# Patient Record
Sex: Female | Born: 2000 | Race: Black or African American | Hispanic: No | Marital: Single | Smoking: Never smoker
Health system: Southern US, Community
[De-identification: ages and names within clinical notes are randomized; demographics above are authoritative.]

## PROBLEM LIST (undated history)

## (undated) HISTORY — PX: HERNIA REPAIR: SHX51

## (undated) HISTORY — PX: NO PAST SURGERIES: SHX2092

---
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
## 2021-02-13 ENCOUNTER — Other Ambulatory Visit: Payer: Self-pay

## 2021-02-13 ENCOUNTER — Ambulatory Visit
Admission: EM | Admit: 2021-02-13 | Discharge: 2021-02-13 | Disposition: A | Discharge status: Home / Self Care | Payer: Medicaid Other | Attending: Physician Assistant | Admitting: Physician Assistant

## 2021-02-13 DIAGNOSIS — R112 Nausea with vomiting, unspecified: Secondary | ICD-10-CM | POA: Diagnosis present

## 2021-02-13 DIAGNOSIS — N939 Abnormal uterine and vaginal bleeding, unspecified: Secondary | ICD-10-CM | POA: Diagnosis not present

## 2021-02-13 DIAGNOSIS — R1033 Periumbilical pain: Secondary | ICD-10-CM | POA: Diagnosis present

## 2021-02-13 DIAGNOSIS — B9689 Other specified bacterial agents as the cause of diseases classified elsewhere: Secondary | ICD-10-CM

## 2021-02-13 DIAGNOSIS — N760 Acute vaginitis: Secondary | ICD-10-CM

## 2021-02-13 LAB — URINALYSIS, COMPLETE (UACMP) WITH MICROSCOPIC
Bilirubin Urine: NEGATIVE
Glucose, UA: NEGATIVE mg/dL
Ketones, ur: NEGATIVE mg/dL
Nitrite: NEGATIVE
Protein, ur: 100 mg/dL — AB
RBC / HPF: >50 RBC/hpf (ref 0–5)
Specific Gravity, Urine: 1.02 (ref 1.005–1.030)
WBC, UA: >50 WBC/hpf (ref 0–5)
pH: 6.5 (ref 5.0–8.0)

## 2021-02-13 LAB — WET PREP, GENITAL
Sperm: NONE SEEN
Trich, Wet Prep: NONE SEEN
Yeast Wet Prep HPF POC: NONE SEEN

## 2021-02-13 LAB — PREGNANCY, URINE: Preg Test, Ur: NEGATIVE

## 2021-02-13 MED ORDER — ONDANSETRON HCL 4 MG PO TABS: 4.0000 mg | ORAL_TABLET | Freq: Three times a day (TID) | ORAL | 0 refills | Status: AC | PRN

## 2021-02-13 MED ORDER — METRONIDAZOLE 500 MG PO TABS: 500.0000 mg | ORAL_TABLET | Freq: Two times a day (BID) | ORAL | 0 refills | Status: AC

## 2021-02-13 NOTE — ED Provider Notes (Signed)
[facility]    CSN: [id number] Arrival date & time: 02/13/21  1456      History   Chief Complaint Chief Complaint  Patient presents with  . Abdominal Pain    HPI Vanessa Holland is a 20 y.o. female presenting for umbilical abdominal pain x2 days.  She says that the pain is intermittent.  It is associated with nausea and vomiting.  Patient states that symptoms started about 15 minutes after she had [place] food.  She has had one episode of diarrhea.  Patient states that she was vomiting "all day Monday and yesterday."  She says that today she has not vomited.  She has been able to eat today and hold down her food.  She is drinking well.  Patient denies any fever, fatigue, cough, congestion or sore throat.  Denies any dysuria, urinary frequency or urgency.  Patient does report that she has irregular menstrual periods.  She says that she has been bleeding "every other day since October 2021."  She says that she had her baby at that time and also had a Nexplanon placed.  Patient states that she followed up with gynecology in December and was advised that she was can have irregular periods.  She says that she tried to follow back up, but was not call back.  Patient does admit to some intermittent lower abdominal pain, but denies any today.  She says that it has gotten progressively better since it started a few months ago.  Denies any vaginal discharge, itching or odor.  No concern for STIs.  She has no other complaints or concerns today.  HPI  Past Medical History:  Diagnosis Date  . Umbilical hernia 2020    Patient Active Problem List   Diagnosis Date Noted  . Gestational hypertension 10/01/2020  . Unwanted fertility 10/01/2020  . Vaginal delivery 09/30/2020  . Supervision of normal first teen pregnancy 09/30/2020  . Infection of urinary tract in pregnancy 09/30/2020  . Low back pain during pregnancy 09/22/2020  . Late prenatal care affecting pregnancy 07/18/2020  .  Encounter for supervision of normal first pregnancy in second trimester 06/28/2020  . Attention deficit hyperactivity disorder (ADHD) 01/07/2018    Past Surgical History:  Procedure Laterality Date  . NO PAST SURGERIES      OB History    Gravida  1   Para  1   Term  1   Preterm      AB      Living  1     SAB      IAB      Ectopic      Multiple  0   Live Births  1            Home Medications    Prior to Admission medications   Medication Sig Start Date End Date Taking? Authorizing Provider  metroNIDAZOLE (FLAGYL) 500 MG tablet Take 1 tablet (500 mg total) by mouth 2 (two) times daily for 7 days. 02/13/21 02/20/21 Yes Eusebio Friendly B, PA-C  ondansetron (ZOFRAN) 4 MG tablet Take 1 tablet (4 mg total) by mouth every 8 (eight) hours as needed for up to 5 days for nausea or vomiting. 02/13/21 02/18/21 Yes Shirlee Latch, PA-C  acetaminophen (TYLENOL) 325 MG tablet Take 2 tablets (650 mg total) by mouth every 6 (six) hours as needed. Patient not taking: Reported on 11/22/2020 10/02/20   Sheila Oats, MD  estrogens, conjugated, (PREMARIN) 1.25 MG tablet Take 1 tablet (1.25  mg total) by mouth daily for 7 days. 11/22/20 11/29/20  Vena Austria, MD  ferrous sulfate 325 (65 FE) MG tablet Take 1 tablet (325 mg total) by mouth every other day. Patient not taking: Reported on 11/22/2020 10/03/20   Sheila Oats, MD  amLODipine (NORVASC) 5 MG tablet Take 1 tablet (5 mg total) by mouth daily. 10/02/20 11/14/20  Sheila Oats, MD    Family History No family history on file.  Social History Social History   Tobacco Use  . Smoking status: Never Smoker  . Smokeless tobacco: Never Used  Vaping Use  . Vaping Use: Former  Substance Use Topics  . Alcohol use: Never  . Drug use: Never     Allergies   Bee pollen and Shrimp [shellfish allergy]   Review of Systems Review of Systems  Constitutional: Negative for chills, diaphoresis, fatigue and fever.  HENT:  Negative for congestion, rhinorrhea and sore throat.   Respiratory: Negative for cough and shortness of breath.   Cardiovascular: Negative for chest pain.  Gastrointestinal: Positive for abdominal pain, nausea and vomiting.  Genitourinary: Positive for menstrual problem. Negative for dysuria, pelvic pain and vaginal discharge.  Musculoskeletal: Negative for arthralgias and myalgias.  Skin: Negative for rash.  Neurological: Negative for weakness and headaches.  Hematological: Negative for adenopathy.     Physical Exam Triage Vital Signs ED Triage Vitals  Enc Vitals Group     BP 02/13/21 1506 127/71     Pulse Rate 02/13/21 1506 74     Resp 02/13/21 1506 18     Temp 02/13/21 1506 97.8 F (36.6 C)     Temp Source 02/13/21 1506 Oral     SpO2 02/13/21 1506 100 %     Weight 02/13/21 1507 138 lb (62.6 kg)     Height 02/13/21 1507 5\' 3"  (1.6 m)     Head Circumference --      Peak Flow --      Pain Score 02/13/21 1506 4     Pain Loc --      Pain Edu? --      Excl. in GC? --    No data found.  Updated Vital Signs BP 127/71   Pulse 74   Temp 97.8 F (36.6 C) (Oral)   Resp 18   Ht 5\' 3"  (1.6 m)   Wt 138 lb (62.6 kg)   SpO2 100%   BMI 24.45 kg/m       Physical Exam Vitals and nursing note reviewed.  Constitutional:      General: She is not in acute distress.    Appearance: Normal appearance. She is not ill-appearing or toxic-appearing.  HENT:     Head: Normocephalic and atraumatic.     Nose: Nose normal.     Mouth/Throat:     Mouth: Mucous membranes are moist.     Pharynx: Oropharynx is clear.  Eyes:     General: No scleral icterus.       Right eye: No discharge.        Left eye: No discharge.     Conjunctiva/sclera: Conjunctivae normal.  Cardiovascular:     Rate and Rhythm: Normal rate and regular rhythm.     Heart sounds: Normal heart sounds.  Pulmonary:     Effort: Pulmonary effort is normal. No respiratory distress.     Breath sounds: Normal breath sounds.   Abdominal:     Tenderness: There is abdominal tenderness (mild epigastric TTP only). There is no guarding or  rebound.  Musculoskeletal:     Cervical back: Neck supple.  Skin:    General: Skin is dry.  Neurological:     General: No focal deficit present.     Mental Status: She is alert. Mental status is at baseline.     Motor: No weakness.     Gait: Gait normal.  Psychiatric:        Mood and Affect: Mood normal.        Behavior: Behavior normal.        Thought Content: Thought content normal.      UC Treatments / Results  Labs (all labs ordered are listed, but only abnormal results are displayed) Labs Reviewed  WET PREP, GENITAL - Abnormal; Notable for the following components:      Result Value   Clue Cells Wet Prep HPF POC PRESENT (*)    WBC, Wet Prep HPF POC MANY (*)    All other components within normal limits  URINALYSIS, COMPLETE (UACMP) WITH MICROSCOPIC - Abnormal; Notable for the following components:   Color, Urine AMBER (*)    APPearance CLOUDY (*)    Hgb urine dipstick LARGE (*)    Protein, ur 100 (*)    Leukocytes,Ua TRACE (*)    Bacteria, UA MANY (*)    All other components within normal limits  URINE CULTURE  PREGNANCY, URINE    EKG   Radiology No results found.  Procedures Procedures (including critical care time)  Medications Ordered in UC Medications - No data to display  Initial Impression / Assessment and Plan / UC Course  I have reviewed the triage vital signs and the nursing notes.  Pertinent labs & imaging results that were available during my care of the patient were reviewed by me and considered in my medical decision making (see chart for details).   1.  Epigastric pain/nausea/vomiting: Based on patient's symptoms, concern for food poisoning which is adverse reaction to the [place] food that she ate.  Advised increasing rest and fluids.  It does seem like symptoms of gotten better today.  I did send Zofran to pharmacy.  Advised  Tylenol for discomfort.  Advised to return or go to ED for any worsening abdominal pain or if she develops a fever, dark stools or blood in the stool.  2.  Bacterial vaginosis: Wet prep shows positive clue cells.  Discussed this with patient and advised treatment with metronidazole.  She opted for the oral medication.  Advised her to take the full course.  3.  Lower abdominal pain and irregular menstrual periods.:  I did offer lab work today including CBC and CMP.  Patient declined.  Advised her that she needs to follow-up with OB/GYN as soon as possible.  Advised to call tomorrow to make a follow-up appointment.  Advised that symptoms may be due to the birth control and she should discuss other options.  Advised also that she is possible she may need an ultrasound of her pelvic region.  Patient does agree and says she will call tomorrow.  Advised to go to ED for any severe pelvic pain or if she has any increased bleeding or fatigue or weakness.   Final Clinical Impressions(s) / UC Diagnoses   Final diagnoses:  Periumbilical abdominal pain  Non-intractable vomiting with nausea, unspecified vomiting type  Bacterial vaginosis  Abnormal vaginal bleeding     Discharge Instructions     Your abdominal pain and nausea/vomiting is likely due to the food that you ate.  If  this is true food poisoning it should only last a couple of days and it does seem like you are improving.  You should increase your rest and fluids.  I have sent Zofran to the pharmacy for you for the nausea and vomiting.  If the abdominal pain worsens or you develop a fever or intractable vomiting or weakness you should be seen again either here or in the ED.  Your vaginal swab shows that you have bacterial vaginosis.  See more about this in the instructions I have sent.  Does require treatment with an antibiotic.  You should contact your OB/gynecologist as soon as possible to discuss an appointment.  You have had irregular bleeding  for several months following the delivery of your baby and the start of Nexplanon.  There are other birth control options that you can discuss that do not cause as much problems with bleeding.  Go to ED if you have any severe pelvic pain, fatigue or weakness, excessive or worsening bleeding, or any symptoms worsen.    ED Prescriptions    Medication Sig Dispense Auth. Provider   metroNIDAZOLE (FLAGYL) 500 MG tablet Take 1 tablet (500 mg total) by mouth 2 (two) times daily for 7 days. 14 tablet Eusebio Friendly B, PA-C   ondansetron (ZOFRAN) 4 MG tablet Take 1 tablet (4 mg total) by mouth every 8 (eight) hours as needed for up to 5 days for nausea or vomiting. 15 tablet Gareth Morgan     PDMP not reviewed this encounter.   Shirlee Latch, PA-C 02/13/21 1645

## 2021-02-13 NOTE — ED Triage Notes (Signed)
Pt reports having abd pain and several episodes of emesis since Monday. sts she is not sure if it is food poisoning, she had [place] food on Monday.

## 2021-02-13 NOTE — Discharge Instructions (Signed)
Your abdominal pain and nausea/vomiting is likely due to the food that you ate.  If this is true food poisoning it should only last a couple of days and it does seem like you are improving.  You should increase your rest and fluids.  I have sent Zofran to the pharmacy for you for the nausea and vomiting.  If the abdominal pain worsens or you develop a fever or intractable vomiting or weakness you should be seen again either here or in the ED.  Your vaginal swab shows that you have bacterial vaginosis.  See more about this in the instructions I have sent.  Does require treatment with an antibiotic.  You should contact your OB/gynecologist as soon as possible to discuss an appointment.  You have had irregular bleeding for several months following the delivery of your baby and the start of Nexplanon.  There are other birth control options that you can discuss that do not cause as much problems with bleeding.  Go to ED if you have any severe pelvic pain, fatigue or weakness, excessive or worsening bleeding, or any symptoms worsen.

## 2021-02-15 LAB — URINE CULTURE: Culture: 40000 — AB

## 2021-03-21 ENCOUNTER — Other Ambulatory Visit: Payer: Self-pay

## 2021-03-21 ENCOUNTER — Ambulatory Visit
Admission: EM | Admit: 2021-03-21 | Discharge: 2021-03-21 | Disposition: A | Discharge status: Home / Self Care | Payer: Medicaid Other | Attending: Family Medicine | Admitting: Family Medicine

## 2021-03-21 DIAGNOSIS — R1031 Right lower quadrant pain: Secondary | ICD-10-CM

## 2021-03-21 NOTE — ED Notes (Signed)
Patient is being discharged from the Urgent Care and sent to the Emergency Department via POV . Per Dr. Adriana Simas, patient is in need of higher level of care due to possible appendicitis. Patient is aware and verbalizes understanding of plan of care.  Vitals:   03/21/21 1120  BP: 118/70  Pulse: 96  Resp: 18  Temp: 98.5 F (36.9 C)  SpO2: 100%

## 2021-03-21 NOTE — ED Triage Notes (Signed)
Patient complains of RLQ pain that started 2 days ago and has been worsening. Patient states that pain has been constant.

## 2021-03-21 NOTE — ED Provider Notes (Signed)
[facility]    CSN: [id number] Arrival date & time: 03/21/21  1057      History   Chief Complaint Chief Complaint  Patient presents with  . Abdominal Pain    RLQ   HPI   20 year old female presents with abdominal pain.  Patient reports that her pain has been worsening over the past 2 to 3 days.  Located in the right lower quadrant..  Denies nausea, vomiting.  Denies anorexia.  No fever.  Pain is 8/10 in severity.  No relieving factors.  No other associated symptoms.  No other complaints.  Past Medical History:  Diagnosis Date  . Umbilical hernia 2020    Patient Active Problem List   Diagnosis Date Noted  . Gestational hypertension 10/01/2020  . Unwanted fertility 10/01/2020  . Vaginal delivery 09/30/2020  . Supervision of normal first teen pregnancy 09/30/2020  . Infection of urinary tract in pregnancy 09/30/2020  . Low back pain during pregnancy 09/22/2020  . Late prenatal care affecting pregnancy 07/18/2020  . Encounter for supervision of normal first pregnancy in second trimester 06/28/2020  . Attention deficit hyperactivity disorder (ADHD) 01/07/2018    Past Surgical History:  Procedure Laterality Date  . NO PAST SURGERIES      OB History    Gravida  1   Para  1   Term  1   Preterm      AB      Living  1     SAB      IAB      Ectopic      Multiple  0   Live Births  1            Home Medications    Prior to Admission medications   Medication Sig Start Date End Date Taking? Authorizing Provider  acetaminophen (TYLENOL) 325 MG tablet Take 2 tablets (650 mg total) by mouth every 6 (six) hours as needed. 10/02/20  Yes Sheila Oats, MD  estrogens, conjugated, (PREMARIN) 1.25 MG tablet Take 1 tablet (1.25 mg total) by mouth daily for 7 days. 11/22/20 11/29/20  Vena Austria, MD  ferrous sulfate 325 (65 FE) MG tablet TAKE 1 TABLET (325 MG TOTAL) BY MOUTH EVERY OTHER DAY. Patient not taking: No sig reported 10/02/20  10/02/21  Sheila Oats, MD  Prenatal 27-1 MG TABS TAKE 1 TABLET BY MOUTH DAILY AT 12 NOON 10/02/20 10/02/21  Alric Seton, MD  amLODipine (NORVASC) 5 MG tablet Take 1 tablet (5 mg total) by mouth daily. 10/02/20 11/14/20  Sheila Oats, MD    Family History Family History  Problem Relation Age of Onset  . Healthy Mother   . Healthy Father     Social History Social History   Tobacco Use  . Smoking status: Never Smoker  . Smokeless tobacco: Never Used  Vaping Use  . Vaping Use: Former  Substance Use Topics  . Alcohol use: Never  . Drug use: Never     Allergies   Bee pollen and Shrimp [shellfish allergy]   Review of Systems Review of Systems  Constitutional: Negative for appetite change and fever.  Gastrointestinal: Positive for abdominal pain. Negative for nausea and vomiting.   Physical Exam Triage Vital Signs ED Triage Vitals  Enc Vitals Group     BP 03/21/21 1120 118/70     Pulse Rate 03/21/21 1120 96     Resp 03/21/21 1120 18     Temp 03/21/21 1120 98.5 F (36.9 C)  Temp Source 03/21/21 1120 Oral     SpO2 03/21/21 1120 100 %     Weight 03/21/21 1117 136 lb (61.7 kg)     Height 03/21/21 1117 5\' 3"  (1.6 m)     Head Circumference --      Peak Flow --      Pain Score 03/21/21 1117 8     Pain Loc --      Pain Edu? --      Excl. in GC? --    Updated Vital Signs BP 118/70 (BP Location: Left Arm)   Pulse 96   Temp 98.5 F (36.9 C) (Oral)   Resp 18   Ht 5\' 3"  (1.6 m)   Wt 61.7 kg   SpO2 100%   BMI 24.09 kg/m   Visual Acuity Right Eye Distance:   Left Eye Distance:   Bilateral Distance:    Right Eye Near:   Left Eye Near:    Bilateral Near:     Physical Exam Vitals and nursing note reviewed.  Constitutional:      General: She is not in acute distress.    Appearance: She is well-developed. She is not ill-appearing.  HENT:     Head: Normocephalic and atraumatic.  Pulmonary:     Effort: Pulmonary effort is normal. No respiratory  distress.  Abdominal:     General: There is no distension.     Palpations: Abdomen is soft.     Comments: Exquisitely tender in the right lower quadrant.  Umbilical hernia noted.  Neurological:     Mental Status: She is alert.  Psychiatric:        Mood and Affect: Mood normal.        Behavior: Behavior normal.     UC Treatments / Results  Labs (all labs ordered are listed, but only abnormal results are displayed) Labs Reviewed - No data to display  EKG   Radiology No results found.  Procedures Procedures (including critical care time)  Medications Ordered in UC Medications - No data to display  Initial Impression / Assessment and Plan / UC Course  I have reviewed the triage vital signs and the nursing notes.  Pertinent labs & imaging results that were available during my care of the patient were reviewed by me and considered in my medical decision making (see chart for details).    20 year old female presents with right lower quadrant pain.  Concern for possible appendicitis.  Patient needs further evaluation in the emergency room.  Patient is going to [facility].   Final Clinical Impressions(s) / UC Diagnoses   Final diagnoses:  RLQ abdominal pain     Discharge Instructions     You need to go to the ER.  You need labs and CT scan to assess for appendicitis.  Either [facility] or [facility].  Take care  Dr. [facility]    ED Prescriptions    None     PDMP not reviewed this encounter.   [facility], DO 03/21/21 1134

## 2021-03-21 NOTE — Discharge Instructions (Signed)
You need to go to the ER.  You need labs and CT scan to assess for appendicitis.  Either [facility] or [facility].  Take care  Dr. Adriana Simas

## 2021-06-24 ENCOUNTER — Encounter: Payer: Self-pay | Admitting: Emergency Medicine

## 2021-06-24 ENCOUNTER — Other Ambulatory Visit: Payer: Self-pay

## 2021-06-24 ENCOUNTER — Ambulatory Visit
Admission: EM | Admit: 2021-06-24 | Discharge: 2021-06-24 | Disposition: A | Discharge status: Home / Self Care | Payer: Medicaid Other | Attending: Internal Medicine | Admitting: Internal Medicine

## 2021-06-24 ENCOUNTER — Ambulatory Visit (INDEPENDENT_AMBULATORY_CARE_PROVIDER_SITE_OTHER): Payer: Medicaid Other

## 2021-06-24 DIAGNOSIS — K5900 Constipation, unspecified: Secondary | ICD-10-CM

## 2021-06-24 DIAGNOSIS — K5901 Slow transit constipation: Secondary | ICD-10-CM | POA: Insufficient documentation

## 2021-06-24 LAB — POCT URINALYSIS DIP (DEVICE)
Bilirubin Urine: NEGATIVE
Glucose, UA: NEGATIVE mg/dL
Hgb urine dipstick: NEGATIVE
Ketones, ur: NEGATIVE mg/dL
Leukocytes,Ua: NEGATIVE
Nitrite: NEGATIVE
Protein, ur: NEGATIVE mg/dL
Specific Gravity, Urine: 1.025 (ref 1.005–1.030)
Urobilinogen, UA: 1 mg/dL (ref 0.0–1.0)
pH: 7 (ref 5.0–8.0)

## 2021-06-24 LAB — POCT PREGNANCY, URINE: Preg Test, Ur: NEGATIVE

## 2021-06-24 MED ORDER — MAGNESIUM CITRATE PO SOLN: 1.0000 | Freq: Once | ORAL | 0 refills | Status: AC

## 2021-06-24 MED ORDER — MAGNESIUM CITRATE 200 MG PO TABS
ORAL_TABLET | ORAL | 0 refills | Status: DC
Start: 2021-06-24 — End: 2022-03-17

## 2021-06-24 NOTE — Discharge Instructions (Addendum)
Drink a bottle of Magnesium Citrate tonight to help you empty out your bowels.  Increase your water intake and fiber  Drink 1-2 bottles of mineral water during the day instead of regular water You may also drink prune juice every morning instead of taking magnesium pills which I sent to your pharmacy.

## 2021-06-24 NOTE — ED Triage Notes (Addendum)
Pt is present today with frequent urination, diarrhea, and abdominal pain. Pt states that her pain started x3 days ago. Pt denies any fever

## 2021-06-24 NOTE — ED Provider Notes (Signed)
[facility]    CSN: [id number] Arrival date & time: 06/24/21  1718      History   Chief Complaint Chief Complaint  Patient presents with   Abdominal Pain    HPI Vanessa Holland is a 20 y.o. female who presents with onset of upper abdominal pain x 3 days. Has hx of chronic constipation and the last time she had a BM was 2 days ago, then today had diarrhea x 1.     Past Medical History:  Diagnosis Date   Umbilical hernia 2020    Patient Active Problem List   Diagnosis Date Noted   Gestational hypertension 10/01/2020   Unwanted fertility 10/01/2020   Vaginal delivery 09/30/2020   Supervision of normal first teen pregnancy 09/30/2020   Infection of urinary tract in pregnancy 09/30/2020   Low back pain during pregnancy 09/22/2020   Late prenatal care affecting pregnancy 07/18/2020   Encounter for supervision of normal first pregnancy in second trimester 06/28/2020   Attention deficit hyperactivity disorder (ADHD) 01/07/2018    Past Surgical History:  Procedure Laterality Date   NO PAST SURGERIES      OB History     Gravida  1   Para  1   Term  1   Preterm      AB      Living  1      SAB      IAB      Ectopic      Multiple  0   Live Births  1            Home Medications    Prior to Admission medications   Medication Sig Start Date End Date Taking? Authorizing Provider  Magnesium Citrate 200 MG TABS Take 1-2 at bed time for chronic constipation. 06/24/21  Yes Rodriguez-Southworth, Nettie Elm, PA-C  magnesium citrate SOLN Take 296 mLs (1 Bottle total) by mouth once for 1 dose. 06/24/21 06/24/21 Yes Rodriguez-Southworth, Nettie Elm, PA-C  acetaminophen (TYLENOL) 325 MG tablet Take 2 tablets (650 mg total) by mouth every 6 (six) hours as needed. 10/02/20   Sheila Oats, MD  estrogens, conjugated, (PREMARIN) 1.25 MG tablet Take 1 tablet (1.25 mg total) by mouth daily for 7 days. 11/22/20 11/29/20  Vena Austria, MD  amLODipine (NORVASC)  5 MG tablet Take 1 tablet (5 mg total) by mouth daily. 10/02/20 11/14/20  Sheila Oats, MD    Family History Family History  Problem Relation Age of Onset   Healthy Mother    Healthy Father     Social History Social History   Tobacco Use   Smoking status: Never   Smokeless tobacco: Never  Vaping Use   Vaping Use: Former  Substance Use Topics   Alcohol use: Never   Drug use: Never     Allergies   Bee pollen and Shrimp [shellfish allergy]   Review of Systems Review of Systems  Constitutional:  Negative for fever.  Gastrointestinal:  Positive for abdominal distention, abdominal pain, constipation and diarrhea. Negative for nausea and vomiting.    Physical Exam Triage Vital Signs ED Triage Vitals  Enc Vitals Group     BP 06/24/21 1820 120/80     Pulse Rate 06/24/21 1820 85     Resp 06/24/21 1820 18     Temp 06/24/21 1820 98.3 F (36.8 C)     Temp src --      SpO2 06/24/21 1820 100 %     Weight --  Height --      Head Circumference --      Peak Flow --      Pain Score 06/24/21 1818 7     Pain Loc --      Pain Edu? --      Excl. in GC? --    No data found.  Updated Vital Signs BP 120/80   Pulse 85   Temp 98.3 F (36.8 C)   Resp 18   LMP 06/13/2021 Comment: denies pre, hcg neg  SpO2 100%   Visual Acuity Right Eye Distance:   Left Eye Distance:   Bilateral Distance:    Right Eye Near:   Left Eye Near:    Bilateral Near:     Physical Exam Constitutional:      General: She is not in acute distress.    Appearance: She is normal weight. She is not toxic-appearing.  HENT:     Head: Normocephalic.     Right Ear: External ear normal.     Left Ear: External ear normal.  Eyes:     General: No scleral icterus.    Conjunctiva/sclera: Conjunctivae normal.  Pulmonary:     Effort: Pulmonary effort is normal.  Abdominal:     General: Bowel sounds are normal.     Palpations: Abdomen is soft.     Tenderness: There is abdominal tenderness. There  is no guarding or rebound.     Comments: Has a ventral hernia to the R of her umbilicus. Has mild tenderness on epigastric and R mid and lower abdomen  Musculoskeletal:        General: Normal range of motion.     Cervical back: Neck supple.  Skin:    General: Skin is warm and dry.     Findings: No rash.  Neurological:     Mental Status: She is alert and oriented to person, place, and time.     Gait: Gait normal.  Psychiatric:        Mood and Affect: Mood normal.        Behavior: Behavior normal.        Thought Content: Thought content normal.        Judgment: Judgment normal.     UC Treatments / Results  Labs (all labs ordered are listed, but only abnormal results are displayed) Labs Reviewed  POCT URINALYSIS DIPSTICK, ED / UC  POCT URINALYSIS DIP (DEVICE)  POC URINE PREG, ED  POCT PREGNANCY, URINE    EKG   Radiology DG Abd 2 Views  Result Date: 06/24/2021 CLINICAL DATA:  Constipation EXAM: ABDOMEN - 2 VIEW COMPARISON:  None. FINDINGS: The bowel gas pattern is normal. There is no evidence of free air. No radio-opaque calculi or other significant radiographic abnormality is seen. IMPRESSION: Normal abdominal radiographs. Electronically Signed   By: Deatra Robinson M.D.   On: 06/24/2021 19:32    Procedures Procedures (including critical care time)  Medications Ordered in UC Medications - No data to display  Initial Impression / Assessment and Plan / UC Course  I have reviewed the triage vital signs and the nursing notes. Pertinent labs & imaging results that were available during my care of the patient were reviewed by me and considered in my medical decision making (see chart for details). Advised to Fu with PCP in one week if symptoms do not improve with emptying her colon.  Has chronic constipation and ventral hernia. See instructions.     Final Clinical Impressions(s) / UC Diagnoses  Final diagnoses:  Slow transit constipation     Discharge Instructions       Drink a bottle of Magnesium Citrate tonight to help you empty out your bowels.  Increase your water intake and fiber  Drink 1-2 bottles of mineral water during the day instead of regular water You may also drink prune juice every morning instead of taking magnesium pills which I sent to your pharmacy.      ED Prescriptions     Medication Sig Dispense Auth. Provider   magnesium citrate SOLN Take 296 mLs (1 Bottle total) by mouth once for 1 dose. 195 mL Rodriguez-Southworth, Luria Rosario, PA-C   Magnesium Citrate 200 MG TABS Take 1-2 at bed time for chronic constipation. 90 tablet Rodriguez-Southworth, Nettie Elm, PA-C      PDMP not reviewed this encounter.   Garey Ham, Cordelia Poche 06/24/21 2038

## 2021-11-01 ENCOUNTER — Ambulatory Visit
Admission: EM | Admit: 2021-11-01 | Discharge: 2021-11-01 | Disposition: A | Discharge status: Home / Self Care | Payer: Medicaid Other

## 2021-11-01 ENCOUNTER — Other Ambulatory Visit: Payer: Self-pay

## 2021-11-15 ENCOUNTER — Other Ambulatory Visit: Payer: Self-pay

## 2021-11-15 ENCOUNTER — Ambulatory Visit
Admission: EM | Admit: 2021-11-15 | Discharge: 2021-11-15 | Disposition: A | Discharge status: Home / Self Care | Payer: Medicaid Other

## 2021-11-15 ENCOUNTER — Encounter: Payer: Self-pay | Admitting: Emergency Medicine

## 2021-11-15 DIAGNOSIS — M25561 Pain in right knee: Secondary | ICD-10-CM

## 2021-11-15 MED ORDER — NAPROXEN 500 MG PO TABS: 500.0000 mg | ORAL_TABLET | Freq: Two times a day (BID) | ORAL | 0 refills | Status: DC

## 2021-11-15 NOTE — ED Provider Notes (Signed)
[facility]    CSN: [id number] Arrival date & time: 11/15/21  1605      History   Chief Complaint Chief Complaint  Patient presents with   Knee Pain    right    HPI Vanessa Holland is a 20 y.o. female.   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  She states that in this effort she twisted her left knee and felt a pop sensation but did not fall.  She reports that she felt a fullness sensation in the left knee along with pain and had some tenderness in knee and increased warmth for the first few days after.  Symptoms have been flared since off and on.  She reports that she tripped a few days ago but did not think that she really aggravated the knee however yesterday the knee pain worsened again and she felt that she needed to have a evaluation as this has been giving her trouble.  She denies any significant pain with ambulation.  She feels that her knee is slightly unstable.  No previous trouble with this knee or past injury.  She has tried a knee brace but she thinks that this was too tight and this was painful to her. She has also tried cold compress and tylenol.   Past Medical History:  Diagnosis Date   Umbilical hernia 2020    Patient Active Problem List   Diagnosis Date Noted   Gestational hypertension 10/01/2020   Unwanted fertility 10/01/2020   Vaginal delivery 09/30/2020   Supervision of normal first teen pregnancy 09/30/2020   Infection of urinary tract in pregnancy 09/30/2020   Low back pain during pregnancy 09/22/2020   Late prenatal care affecting pregnancy 07/18/2020   Encounter for supervision of normal first pregnancy in second trimester 06/28/2020   Attention deficit hyperactivity disorder (ADHD) 01/07/2018    Past Surgical History:  Procedure Laterality Date   NO PAST SURGERIES      OB History     Gravida  1   Para  1   Term  1   Preterm      AB      Living  1       SAB      IAB      Ectopic      Multiple  0   Live Births  1            Home Medications    Prior to Admission medications   Medication Sig Start Date End Date Taking? Authorizing Provider  etonogestrel (NEXPLANON) 68 MG IMPL implant 1 each by Subdermal route once.   Yes [provider]  acetaminophen (TYLENOL) 325 MG tablet Take 2 tablets (650 mg total) by mouth every 6 (six) hours as needed. 10/02/20   Sheila Oats, MD  estrogens, conjugated, (PREMARIN) 1.25 MG tablet Take 1 tablet (1.25 mg total) by mouth daily for 7 days. 11/22/20 11/29/20  Vena Austria, MD  Magnesium Citrate 200 MG TABS Take 1-2 at bed time for chronic constipation. 06/24/21   Rodriguez-Southworth, Nettie Elm, PA-C  amLODipine (NORVASC) 5 MG tablet Take 1 tablet (5 mg total) by mouth daily. 10/02/20 11/14/20  Sheila Oats, MD    Family History Family History  Problem Relation Age of Onset   Healthy Mother    Healthy Father     Social History Social History   Tobacco Use   Smoking  status: Never   Smokeless tobacco: Never  Vaping Use   Vaping Use: Some days  Substance Use Topics   Alcohol use: Never   Drug use: Never     Allergies   Bee pollen and Shrimp [shellfish allergy]   Review of Systems Review of Systems  As stated above in HPI Physical Exam Triage Vital Signs ED Triage Vitals  Enc Vitals Group     BP 11/15/21 1750 115/76     Pulse Rate 11/15/21 1750 75     Resp 11/15/21 1750 14     Temp 11/15/21 1750 98.9 F (37.2 C)     Temp Source 11/15/21 1750 Oral     SpO2 11/15/21 1750 98 %     Weight 11/15/21 1748 136 lb (61.7 kg)     Height 11/15/21 1748 5\' 3"  (1.6 m)     Head Circumference --      Peak Flow --      Pain Score 11/15/21 1747 8     Pain Loc --      Pain Edu? --      Excl. in GC? --    No data found.  Updated Vital Signs BP 115/76 (BP Location: Right Arm)   Pulse 75   Temp 98.9 F (37.2 C) (Oral)   Resp 14   Ht 5\' 3"  (1.6 m)   Wt 136  lb (61.7 kg)   SpO2 98%   Breastfeeding No   BMI 24.09 kg/m   Physical Exam Vitals and nursing note reviewed.  Constitutional:      General: She is not in acute distress.    Appearance: Normal appearance. She is not ill-appearing, toxic-appearing or diaphoretic.  HENT:     Head: Normocephalic and atraumatic.  Musculoskeletal:     Right knee: Swelling (mild of anterior knee) and effusion present. No deformity, erythema, ecchymosis, lacerations, bony tenderness or crepitus. Decreased range of motion (25% decrease throughout due to pain). Tenderness present over the medial joint line, lateral joint line, MCL, LCL and patellar tendon. Abnormal meniscus. Normal alignment and normal patellar mobility.     Instability Tests: Anterior drawer test negative. Posterior drawer test negative. Anterior Lachman test negative. Medial McMurray test negative and lateral McMurray test negative.     Left knee: Normal.  Skin:    General: Skin is warm.  Neurological:     General: No focal deficit present.     Mental Status: She is alert and oriented to person, place, and time.     Motor: No weakness.     Gait: Gait normal.     Deep Tendon Reflexes: Reflexes normal.     UC Treatments / Results  Labs (all labs ordered are listed, but only abnormal results are displayed) Labs Reviewed - No data to display  EKG   Radiology No results found.  Procedures Procedures (including critical care time)  Medications Ordered in UC Medications - No data to display  Initial Impression / Assessment and Plan / UC Course  I have reviewed the triage vital signs and the nursing notes.  Pertinent labs & imaging results that were available during my care of the patient were reviewed by me and considered in my medical decision making (see chart for details).     New.  As this injury occurred over a month ago and she is able to ambulate without bony tenderness we are going to hold off on x-ray imaging at this  time.  Instead I believe that she likely  has an injury within the knee.  I will treat with a knee stabilizer along with crutches with nonweightbearing status for the next 2 weeks and follow-up with orthopedics.  She can use cold compress and NSAIDs. Final Clinical Impressions(s) / UC Diagnoses   Final diagnoses:  None   Discharge Instructions   None    ED Prescriptions   None    PDMP not reviewed this encounter.   Rushie Chestnut, [place] 11/15/21 1831

## 2021-11-15 NOTE — ED Triage Notes (Signed)
Patient states that las month on 10/12/21 she twisted her right knee and has since been having pain and swelling in her right knee.

## 2022-03-17 ENCOUNTER — Ambulatory Visit
Admission: EM | Admit: 2022-03-17 | Discharge: 2022-03-17 | Disposition: A | Discharge status: Home / Self Care | Payer: Medicaid Other | Attending: Physician Assistant | Admitting: Physician Assistant

## 2022-03-17 DIAGNOSIS — J029 Acute pharyngitis, unspecified: Secondary | ICD-10-CM

## 2022-03-17 DIAGNOSIS — J069 Acute upper respiratory infection, unspecified: Secondary | ICD-10-CM | POA: Diagnosis present

## 2022-03-17 DIAGNOSIS — N898 Other specified noninflammatory disorders of vagina: Secondary | ICD-10-CM | POA: Diagnosis present

## 2022-03-17 LAB — GROUP A STREP BY PCR: Group A Strep by PCR: NOT DETECTED

## 2022-03-17 MED ORDER — METRONIDAZOLE 0.75 % VA GEL: VAGINAL | 0 refills | Status: DC

## 2022-03-17 NOTE — Discharge Instructions (Addendum)
Come back when your period is over if you still have the abnormal vaginal odor ? ?You may take Dayquil as needed for your symptoms. Cold last up to 2 weeks, but if you get a fever of 100.5 or more and worse, come back.  ?

## 2022-03-17 NOTE — ED Triage Notes (Signed)
Pt c/o sore throat, nasal congestion x2-3days  ? ?Pt took a home covid test at home and it was negative. Pt declines covid or flu testing and wants her symptoms treated.  ? ?Pt wants a strep test done.  ? ?Pt wants a female doctor for a personal question.  ?

## 2022-03-17 NOTE — ED Provider Notes (Signed)
?[facility] ? ? ? ?CSN: [id number] ?Arrival date & time: 03/17/22  1549 ? ? ?  ? ?History   ?Chief Complaint ?Chief Complaint  ?Patient presents with  ? Sore Throat  ? Nasal Congestion  ? ? ?HPI ?Vanessa Holland is a 21 y.o. female who presents with URI and ST x 3 days. Had negative Covid test at home.  ?2- has been having abnormal vaginal odor with  thin discharge between her periods. Denies itching. She is currently on her cycle right now. She does not think she could have left a tampon inside her.  ? ? ?Past Medical History:  ?Diagnosis Date  ? Umbilical hernia 2020  ? ? ?Patient Active Problem List  ? Diagnosis Date Noted  ? Gestational hypertension 10/01/2020  ? Unwanted fertility 10/01/2020  ? Vaginal delivery 09/30/2020  ? Supervision of normal first teen pregnancy 09/30/2020  ? Infection of urinary tract in pregnancy 09/30/2020  ? Low back pain during pregnancy 09/22/2020  ? Late prenatal care affecting pregnancy 07/18/2020  ? Encounter for supervision of normal first pregnancy in second trimester 06/28/2020  ? Attention deficit hyperactivity disorder (ADHD) 01/07/2018  ? ? ?Past Surgical History:  ?Procedure Laterality Date  ? NO PAST SURGERIES    ? ? ?OB History   ? ? Gravida  ?1  ? Para  ?1  ? Term  ?1  ? Preterm  ?   ? AB  ?   ? Living  ?1  ?  ? ? SAB  ?   ? IAB  ?   ? Ectopic  ?   ? Multiple  ?0  ? Live Births  ?1  ?   ?  ?  ? ? ? ?Home Medications   ? ?Prior to Admission medications   ?Medication Sig Start Date End Date Taking? Authorizing Provider  ?metroNIDAZOLE (METROGEL VAGINAL) 0.75 % vaginal gel One applicator qhs x 5 nights 03/17/22  Yes Rodriguez-Southworth, Nettie Elm, PA-C  ?amLODipine (NORVASC) 5 MG tablet Take 1 tablet (5 mg total) by mouth daily. 10/02/20 11/14/20  Sheila Oats, MD  ? ? ?Family History ?Family History  ?Problem Relation Age of Onset  ? Healthy Mother   ? Healthy Father   ? ? ?Social History ?Social History  ? ?Tobacco Use  ? Smoking status: Never  ? Smokeless  tobacco: Never  ?Vaping Use  ? Vaping Use: Some days  ?Substance Use Topics  ? Alcohol use: Never  ? Drug use: Never  ? ? ? ?Allergies   ?Bee pollen and Shrimp [shellfish allergy] ? ? ?Review of Systems ?Review of Systems  ?Constitutional:  Negative for appetite change, chills, fatigue and fever.  ?HENT:  Positive for congestion, postnasal drip, rhinorrhea and sore throat. Negative for ear discharge, ear pain and trouble swallowing.   ?Respiratory:  Positive for cough.   ?Genitourinary:  Positive for menstrual problem and vaginal discharge. Negative for dysuria, frequency, genital sores, pelvic pain and urgency.  ?Musculoskeletal:  Negative for myalgias.  ?Neurological:  Negative for headaches.  ?Hematological:  Negative for adenopathy.  ? ? ?Physical Exam ?Triage Vital Signs ?ED Triage Vitals  ?Enc Vitals Group  ?   BP 03/17/22 1600 115/84  ?   Pulse Rate 03/17/22 1600 93  ?   Resp 03/17/22 1600 18  ?   Temp 03/17/22 1600 98.2 ?F (36.8 ?C)  ?   Temp Source 03/17/22 1600 Oral  ?   SpO2 03/17/22 1600 98 %  ?   Weight  03/17/22 1559 138 lb (62.6 kg)  ?   Height 03/17/22 1559 5\' 3"  (1.6 m)  ?   Head Circumference --   ?   Peak Flow --   ?   Pain Score 03/17/22 1558 8  ?   Pain Loc --   ?   Pain Edu? --   ?   Excl. in GC? --   ? ?No data found. ? ?Updated Vital Signs ?BP 115/84 (BP Location: Left Arm)   Pulse 93   Temp 98.2 ?F (36.8 ?C) (Oral)   Resp 18   Ht 5\' 3"  (1.6 m)   Wt 138 lb (62.6 kg)   LMP 03/17/2022   SpO2 98%   BMI 24.45 kg/m?  ? ?Visual Acuity ?Right Eye Distance:   ?Left Eye Distance:   ?Bilateral Distance:   ? ?Right Eye Near:   ?Left Eye Near:    ?Bilateral Near:    ? ?Physical Exam ?Vitals reviewed.  ?HENT:  ?   Head: Normocephalic.  ?   Right Ear: Tympanic membrane, ear canal and external ear normal.  ?   Left Ear: Tympanic membrane, ear canal and external ear normal.  ?   Mouth/Throat:  ?   Mouth: Mucous membranes are moist.  ?   Pharynx: Oropharynx is clear.  ?Eyes:  ?   General: No scleral  icterus. ?   Conjunctiva/sclera: Conjunctivae normal.  ?Pulmonary:  ?   Effort: Pulmonary effort is normal.  ?   Breath sounds: Normal breath sounds.  ?Genitourinary: ?   General: Normal vulva.  ?   Comments: No FB seen. She is bleeding from menses  ?Musculoskeletal:     ?   General: Normal range of motion.  ?   Cervical back: Neck supple.  ?Lymphadenopathy:  ?   Cervical: No cervical adenopathy.  ?Skin: ?   General: Skin is warm and dry.  ?   Findings: No rash.  ?Neurological:  ?   Mental Status: She is alert and oriented to person, place, and time.  ?   Gait: Gait normal.  ?Psychiatric:     ?   Mood and Affect: Mood normal.     ?   Behavior: Behavior normal.     ?   Thought Content: Thought content normal.     ?   Judgment: Judgment normal.  ? ? ? ?UC Treatments / Results  ?Labs ?(all labs ordered are listed, but only abnormal results are displayed) ?Labs Reviewed  ?GROUP A STREP BY PCR  ? ? ?EKG ? ? ?Radiology ?No results found. ? ?Procedures ?Procedures (including critical care time) ? ?Medications Ordered in UC ?Medications - No data to display ? ?Initial Impression / Assessment and Plan / UC Course  ?I have reviewed the triage vital signs and the nursing notes. ? ?PCR strep test is negative ?Has URI and possibly BV ? ?I placed her on Metrogel vaginal as noted. See instructions.  ? ?Final Clinical Impressions(s) / UC Diagnoses  ? ?Final diagnoses:  ?Upper respiratory tract infection, unspecified type  ?Acute pharyngitis, unspecified etiology  ?Vaginal odor  ? ? ? ?Discharge Instructions   ? ?  ?Come back when your period is over if you still have the abnormal vaginal odor ? ?You may take Dayquil as needed for your symptoms. Cold last up to 2 weeks, but if you get a fever of 100.5 or more and worse, come back.  ? ? ? ? ?ED Prescriptions   ? ? Medication Sig Dispense  Auth. Provider  ? metroNIDAZOLE (METROGEL VAGINAL) 0.75 % vaginal gel One applicator qhs x 5 nights 70 g Rodriguez-Southworth, Nettie ElmSylvia, PA-C  ? ?   ? ?PDMP not reviewed this encounter. ?  ?Garey HamRodriguez-Southworth, Jama Krichbaum, PA-C ?03/17/22 1721 ? ?

## 2022-05-11 ENCOUNTER — Encounter: Payer: Self-pay | Admitting: Emergency Medicine

## 2022-05-11 ENCOUNTER — Ambulatory Visit
Admission: EM | Admit: 2022-05-11 | Discharge: 2022-05-11 | Disposition: A | Discharge status: Home / Self Care | Payer: Medicaid Other

## 2022-05-11 DIAGNOSIS — S29019A Strain of muscle and tendon of unspecified wall of thorax, initial encounter: Secondary | ICD-10-CM

## 2022-05-11 MED ORDER — BACLOFEN 10 MG PO TABS: 10.0000 mg | ORAL_TABLET | Freq: Three times a day (TID) | ORAL | 0 refills | Status: DC

## 2022-05-11 MED ORDER — IBUPROFEN 600 MG PO TABS: 600.0000 mg | ORAL_TABLET | Freq: Four times a day (QID) | ORAL | 0 refills | Status: DC | PRN

## 2022-05-11 NOTE — ED Provider Notes (Signed)
[facility]    CSN: [id number] Arrival date & time: 05/11/22  1545      History   Chief Complaint Chief Complaint  Patient presents with   Back Pain    HPI Mica Queener is a 21 y.o. female.   HPI  21 year old female here for evaluation of right-sided back pain.  Patient reports that she has been experiencing pain in her right mid and low back for the past 2 days.  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  She states that she has not told her supervisor about her pain and I have encouraged her to do so.  She states that the pain increases with forward flexion and with rotation.  She also has increase in pain with deep respiration.  There is no radiation into her lower extremities.  Past Medical History:  Diagnosis Date   Umbilical hernia [id number]    Patient Active Problem List   Diagnosis Date Noted   Gestational hypertension 10/01/2020   Unwanted fertility 10/01/2020   Vaginal delivery 09/30/2020   Supervision of normal first teen pregnancy 09/30/2020   Infection of urinary tract in pregnancy 09/30/2020   Low back pain during pregnancy 09/22/2020   Late prenatal care affecting pregnancy 07/18/2020   Encounter for supervision of normal first pregnancy in second trimester 06/28/2020   Attention deficit hyperactivity disorder (ADHD) 01/07/2018    Past Surgical History:  Procedure Laterality Date   NO PAST SURGERIES      OB History     Gravida  1   Para  1   Term  1   Preterm      AB      Living  1      SAB      IAB      Ectopic      Multiple  0   Live Births  1            Home Medications    Prior to Admission medications   Medication Sig Start Date End Date Taking? Authorizing Provider  baclofen (LIORESAL) 10 MG tablet Take 1 tablet (10 mg total) by mouth 3 (three) times daily. 05/11/22  Yes Margarette Canada, NP  etonogestrel  (NEXPLANON) 68 MG IMPL implant 1 each by Subdermal route once.   Yes [provider]  ibuprofen (ADVIL) 600 MG tablet Take 1 tablet (600 mg total) by mouth every 6 (six) hours as needed. 05/11/22  Yes Margarette Canada, NP  amLODipine (NORVASC) 5 MG tablet Take 1 tablet (5 mg total) by mouth daily. 10/02/20 11/14/20  Randa Ngo, MD    Family History Family History  Problem Relation Age of Onset   Healthy Mother    Healthy Father     Social History Social History   Tobacco Use   Smoking status: Never   Smokeless tobacco: Never  Vaping Use   Vaping Use: Some days  Substance Use Topics   Alcohol use: Never   Drug use: Never     Allergies   Bee pollen and Shrimp [shellfish allergy]   Review of Systems Review of Systems  Musculoskeletal:  Positive for back pain.  Neurological:  Negative for weakness and numbness.  Hematological: Negative.   Psychiatric/Behavioral: Negative.      Physical Exam Triage Vital Signs ED Triage Vitals  Enc Vitals Group     BP 05/11/22 1602  119/78     Pulse Rate 05/11/22 1602 85     Resp 05/11/22 1602 14     Temp 05/11/22 1602 99.5 F (37.5 C)     Temp Source 05/11/22 1602 Oral     SpO2 05/11/22 1602 100 %     Weight 05/11/22 1600 140 lb (63.5 kg)     Height 05/11/22 1600 5\' 3"  (1.6 m)     Head Circumference --      Peak Flow --      Pain Score 05/11/22 1600 8     Pain Loc --      Pain Edu? --      Excl. in [place]? --    No data found.  Updated Vital Signs BP 119/78 (BP Location: Left Arm)   Pulse 85   Temp 99.5 F (37.5 C) (Oral)   Resp 14   Ht 5\' 3"  (1.6 m)   Wt 140 lb (63.5 kg)   LMP 05/10/2022 (Exact Date)   SpO2 100%   Breastfeeding No   BMI 24.80 kg/m   Visual Acuity Right Eye Distance:   Left Eye Distance:   Bilateral Distance:    Right Eye Near:   Left Eye Near:    Bilateral Near:     Physical Exam Vitals and nursing note reviewed.  Constitutional:      Appearance: Normal appearance. She is not  ill-appearing.  HENT:     Head: Normocephalic and atraumatic.  Cardiovascular:     Rate and Rhythm: Normal rate and regular rhythm.     Pulses: Normal pulses.     Heart sounds: Normal heart sounds. No murmur heard.   No friction rub. No gallop.  Pulmonary:     Effort: Pulmonary effort is normal.     Breath sounds: Normal breath sounds. No wheezing, rhonchi or rales.  Musculoskeletal:        General: Tenderness and signs of injury present. No swelling or deformity. Normal range of motion.  Skin:    General: Skin is warm and dry.     Capillary Refill: Capillary refill takes less than 2 seconds.  Neurological:     General: No focal deficit present.     Mental Status: She is alert and oriented to person, place, and time.  Psychiatric:        Mood and Affect: Mood normal.        Behavior: Behavior normal.        Thought Content: Thought content normal.        Judgment: Judgment normal.     UC Treatments / Results  Labs (all labs ordered are listed, but only abnormal results are displayed) Labs Reviewed - No data to display   EKG   Radiology No results found.  Procedures Procedures (including critical care time)  Medications Ordered in UC Medications - No data to display  Initial Impression / Assessment and Plan / UC Course  I have reviewed the triage vital signs and the nursing notes.  Pertinent labs & imaging results that were available during my care of the patient were reviewed by me and considered in my medical decision making (see chart for details).  Patient is a pleasant, nontoxic-appearing 21 year old female here for evaluation of right-sided thoracic and low back pain that been going of the past 2 days as outlined HPI above.  Pain began after lifting a patient that weighed approximately 100 pounds off of the floor of her room where she works.  She states that  the patient had slid off the bed onto the floor.  She states that the pain is located in the back only  and increases with lateral rotation and forward flexion.  Also increases with deep respiration.  She denies any radiation into her legs.  On exam patient has a benign cardiopulmonary exam with clear lung sounds in all fields.  She does have tenderness and spasm in her right lateral thoracic paraspinous region.  She does have some mild tenderness but no spasm in her lumbar region.  No midline spinous tenderness.  Patient exam is consistent with a thoracic strain.  I will treat her with ibuprofen and baclofen.  Also home PT and moist heat.  I have given her a work note to keep her out of work for the next 2 days.  I have informed her that she needs to notify her supervisor that her back pain began after lifting a patient off of the floor on assisted.  She verbalized understanding of same.   Final Clinical Impressions(s) / UC Diagnoses   Final diagnoses:  Thoracic myofascial strain, initial encounter     Discharge Instructions      Take the ibuprofen, 600 mg every 6 hours with food, on a schedule for the next 48 hours and then as needed.  Take the baclofen, 10 mg every 8 hours, on a schedule for the next 48 hours and then as needed.  Apply moist heat to your back for 30 minutes at a time 2-3 times a day to improve blood flow to the area and help remove the lactic acid causing the spasm.  Follow the back exercises given at discharge.  Return for reevaluation for any new or worsening symptoms.      ED Prescriptions     Medication Sig Dispense Auth. Provider   ibuprofen (ADVIL) 600 MG tablet Take 1 tablet (600 mg total) by mouth every 6 (six) hours as needed. 30 tablet Margarette Canada, NP   baclofen (LIORESAL) 10 MG tablet Take 1 tablet (10 mg total) by mouth 3 (three) times daily. 14 each Margarette Canada, NP      PDMP not reviewed this encounter.   Margarette Canada, NP 05/11/22 [phone]

## 2022-05-11 NOTE — Discharge Instructions (Signed)
Take the ibuprofen, 600 mg every 6 hours with food, on a schedule for the next 48 hours and then as needed.  Take the baclofen, 10 mg every 8 hours, on a schedule for the next 48 hours and then as needed.  Apply moist heat to your back for 30 minutes at a time 2-3 times a day to improve blood flow to the area and help remove the lactic acid causing the spasm.  Follow the back exercises given at discharge.  Return for reevaluation for any new or worsening symptoms.  

## 2022-05-11 NOTE — ED Triage Notes (Signed)
Patient c/o right sided lower back pain that started 2 days ago.  Patient states that she is on her menstrual cycle.  Patient denies dysuria.  Patient denies N/V.  Patient denies vaginal discharge.  Patient has been taking Ibuprofen with no relief.

## 2022-06-12 ENCOUNTER — Ambulatory Visit
Admission: EM | Admit: 2022-06-12 | Discharge: 2022-06-12 | Disposition: A | Discharge status: Home / Self Care | Payer: Medicaid Other | Attending: Emergency Medicine | Admitting: Emergency Medicine

## 2022-06-12 ENCOUNTER — Other Ambulatory Visit: Payer: Self-pay

## 2022-06-12 ENCOUNTER — Encounter: Payer: Self-pay | Admitting: Emergency Medicine

## 2022-06-12 DIAGNOSIS — B9689 Other specified bacterial agents as the cause of diseases classified elsewhere: Secondary | ICD-10-CM

## 2022-06-12 DIAGNOSIS — N760 Acute vaginitis: Secondary | ICD-10-CM | POA: Insufficient documentation

## 2022-06-12 DIAGNOSIS — K2900 Acute gastritis without bleeding: Secondary | ICD-10-CM | POA: Diagnosis present

## 2022-06-12 LAB — COMPREHENSIVE METABOLIC PANEL
ALT: 12 U/L (ref 0–44)
AST: 15 U/L (ref 15–41)
Albumin: 3.9 g/dL (ref 3.5–5.0)
Alkaline Phosphatase: 52 U/L (ref 38–126)
Anion gap: 5 (ref 5–15)
BUN: 15 mg/dL (ref 6–20)
CO2: 26 mmol/L (ref 22–32)
Calcium: 9.5 mg/dL (ref 8.9–10.3)
Chloride: 103 mmol/L (ref 98–111)
Creatinine, Ser: 0.74 mg/dL (ref 0.44–1.00)
GFR, Estimated: >60 mL/min (ref >60)
Glucose, Bld: 97 mg/dL (ref 70–99)
Potassium: 4.4 mmol/L (ref 3.5–5.1)
Sodium: 134 mmol/L — ABNORMAL LOW (ref 135–145)
Total Bilirubin: 0.5 mg/dL (ref 0.3–1.2)
Total Protein: 7.7 g/dL (ref 6.5–8.1)

## 2022-06-12 LAB — WET PREP, GENITAL
Sperm: NONE SEEN
Trich, Wet Prep: NONE SEEN
WBC, Wet Prep HPF POC: <10 (ref <10)
Yeast Wet Prep HPF POC: NONE SEEN

## 2022-06-12 LAB — CBC WITH DIFFERENTIAL/PLATELET
Abs Immature Granulocytes: 0.03 10*3/uL (ref 0.00–0.07)
Basophils Absolute: 0 10*3/uL (ref 0.0–0.1)
Basophils Relative: 0 %
Eosinophils Absolute: 0.1 10*3/uL (ref 0.0–0.5)
Eosinophils Relative: 1 %
HCT: 37.6 % (ref 36.0–46.0)
Hemoglobin: 12.5 g/dL (ref 12.0–15.0)
Immature Granulocytes: 0 %
Lymphocytes Relative: 36 %
Lymphs Abs: 3.6 10*3/uL (ref 0.7–4.0)
MCH: 29.1 pg (ref 26.0–34.0)
MCHC: 33.2 g/dL (ref 30.0–36.0)
MCV: 87.6 fL (ref 80.0–100.0)
Monocytes Absolute: 0.8 10*3/uL (ref 0.1–1.0)
Monocytes Relative: 8 %
Neutro Abs: 5.5 10*3/uL (ref 1.7–7.7)
Neutrophils Relative %: 55 %
Platelets: 250 10*3/uL (ref 150–400)
RBC: 4.29 MIL/uL (ref 3.87–5.11)
RDW: 14.6 % (ref 11.5–15.5)
WBC: 10 10*3/uL (ref 4.0–10.5)
nRBC: 0 % (ref 0.0–0.2)

## 2022-06-12 LAB — URINALYSIS, ROUTINE W REFLEX MICROSCOPIC
Bilirubin Urine: NEGATIVE
Glucose, UA: NEGATIVE mg/dL
Hgb urine dipstick: NEGATIVE
Ketones, ur: NEGATIVE mg/dL
Leukocytes,Ua: NEGATIVE
Nitrite: NEGATIVE
Protein, ur: NEGATIVE mg/dL
Specific Gravity, Urine: 1.02 (ref 1.005–1.030)
pH: 7 (ref 5.0–8.0)

## 2022-06-12 LAB — LIPASE, BLOOD: Lipase: 36 U/L (ref 11–51)

## 2022-06-12 LAB — PREGNANCY, URINE: Preg Test, Ur: NEGATIVE

## 2022-06-12 MED ORDER — ALUM & MAG HYDROXIDE-SIMETH 200-200-20 MG/5ML PO SUSP
30.0000 mL | Freq: Once | ORAL | Status: AC
  Administered 2022-06-12: 30 mL via ORAL

## 2022-06-12 MED ORDER — METRONIDAZOLE 500 MG PO TABS: 500.0000 mg | ORAL_TABLET | Freq: Two times a day (BID) | ORAL | 0 refills | Status: DC

## 2022-06-12 NOTE — ED Triage Notes (Signed)
Pt c/o abdominal pain in her umbilical area and upward. Started about 3 days ago but has gotten worse. She states she has a known hernia. Denies urinary symptoms or vaginal discharge. Denies nausea, vomiting or diarrhea.

## 2022-06-12 NOTE — Discharge Instructions (Addendum)
Take the Flagyl (metronidazole) 500 mg twice daily for treatment of your bacterial vaginosis.  Avoid alcohol while on the metronidazole as taken together will cause of vomiting.  Bacterial vaginosis is often caused by a imbalance of bacteria in your vaginal vault.  This is sometimes a result of using tampons or hormonal fluctuations during her menstrual cycle.  You if your symptoms are recurrent you can try using a boric acid suppository twice weekly to help maintain the acid-base balance in your vagina vault which could prevent further infection.  You can also try vaginal probiotics to help return normal bacterial balance.   For your gastritis avoid eating spicy or greasy foods for the next week and allow the inflammation of your stomach to calm down.  I would follow a bland diet.

## 2022-06-12 NOTE — ED Provider Notes (Signed)
[facility]    CSN: [id number] Arrival date & time: 06/12/22  1543      History   Chief Complaint Chief Complaint  Patient presents with   Hernia   Abdominal Pain    HPI Vanessa Holland is a 21 y.o. female.   HPI  21 year old female here for evaluation of abdominal pain.  Patient reports that she has been experiencing periumbilical and epigastric abdominal pain for the past 3 days.  She states that sometimes the pain is made worse by foods.  She states that if she eats too much or if she eats greasy foods and sometimes it makes the pain worse.  She denies any nausea, vomiting, or diarrhea.  She also denies any fever.  No painful urination or urinary urgency or frequency.  She also denies vaginal discharge or itching.  She does have a history of reflux and denies any burning in her esophagus or sour taste in her mouth.  She does have a known umbilical hernia and states that at one point that was more noticeable but it has reduced in size and gone down in the last day or so.  Past Medical History:  Diagnosis Date   Umbilical hernia 2020    Patient Active Problem List   Diagnosis Date Noted   Gestational hypertension 10/01/2020   Unwanted fertility 10/01/2020   Vaginal delivery 09/30/2020   Supervision of normal first teen pregnancy 09/30/2020   Infection of urinary tract in pregnancy 09/30/2020   Low back pain during pregnancy 09/22/2020   Late prenatal care affecting pregnancy 07/18/2020   Encounter for supervision of normal first pregnancy in second trimester 06/28/2020   Attention deficit hyperactivity disorder (ADHD) 01/07/2018    Past Surgical History:  Procedure Laterality Date   NO PAST SURGERIES      OB History     Gravida  1   Para  1   Term  1   Preterm      AB      Living  1      SAB      IAB      Ectopic      Multiple  0   Live Births  1            Home Medications    Prior to Admission medications   Medication  Sig Start Date End Date Taking? Authorizing Provider  etonogestrel (NEXPLANON) 68 MG IMPL implant 1 each by Subdermal route once.   Yes [provider]  metroNIDAZOLE (FLAGYL) 500 MG tablet Take 1 tablet (500 mg total) by mouth 2 (two) times daily. 06/12/22  Yes Becky Augusta, NP  amLODipine (NORVASC) 5 MG tablet Take 1 tablet (5 mg total) by mouth daily. 10/02/20 11/14/20  Sheila Oats, MD    Family History Family History  Problem Relation Age of Onset   Healthy Mother    Healthy Father     Social History Social History   Tobacco Use   Smoking status: Never   Smokeless tobacco: Never  Vaping Use   Vaping Use: Some days  Substance Use Topics   Alcohol use: Never   Drug use: Never     Allergies   Bee pollen and Shrimp [shellfish allergy]   Review of Systems Review of Systems  Constitutional:  Negative for appetite change and fever.  Gastrointestinal:  Positive for abdominal pain. Negative for diarrhea, nausea and vomiting.  Genitourinary:  Negative for dysuria, frequency, urgency, vaginal discharge and vaginal pain.  Physical Exam Triage Vital Signs ED Triage Vitals [06/12/22 1635]  Enc Vitals Group     BP      Pulse      Resp      Temp      Temp src      SpO2      Weight      Height      Head Circumference      Peak Flow      Pain Score 5     Pain Loc      Pain Edu?      Excl. in [place]?    No data found.  Updated Vital Signs BP 110/79 (BP Location: Left Arm)   Pulse 88   Temp 98.4 F (36.9 C) (Oral)   Resp 16   Ht 5\' 3"  (1.6 m)   Wt 139 lb 15.9 oz (63.5 kg)   LMP 05/10/2022   SpO2 100%   Breastfeeding No   BMI 24.80 kg/m   Visual Acuity Right Eye Distance:   Left Eye Distance:   Bilateral Distance:    Right Eye Near:   Left Eye Near:    Bilateral Near:     Physical Exam Vitals and nursing note reviewed.  Constitutional:      Appearance: Normal appearance. She is not ill-appearing.  HENT:     Head: Normocephalic and  atraumatic.  Cardiovascular:     Rate and Rhythm: Normal rate and regular rhythm.     Pulses: Normal pulses.     Heart sounds: Normal heart sounds. No murmur heard.    No friction rub. No gallop.  Pulmonary:     Effort: Pulmonary effort is normal.     Breath sounds: Normal breath sounds. No wheezing, rhonchi or rales.  Abdominal:     General: Abdomen is flat. Bowel sounds are normal.     Palpations: Abdomen is soft.     Tenderness: There is abdominal tenderness. There is no right CVA tenderness, left CVA tenderness, guarding or rebound.     Hernia: A hernia is present.  Skin:    General: Skin is warm and dry.     Capillary Refill: Capillary refill takes less than 2 seconds.     Findings: No erythema or rash.  Neurological:     General: No focal deficit present.     Mental Status: She is alert and oriented to person, place, and time.  Psychiatric:        Mood and Affect: Mood normal.        Behavior: Behavior normal.        Thought Content: Thought content normal.        Judgment: Judgment normal.      UC Treatments / Results  Labs (all labs ordered are listed, but only abnormal results are displayed) Labs Reviewed  WET PREP, GENITAL - Abnormal; Notable for the following components:      Result Value   Clue Cells Wet Prep HPF POC PRESENT (*)    All other components within normal limits  COMPREHENSIVE METABOLIC PANEL - Abnormal; Notable for the following components:   Sodium 134 (*)    All other components within normal limits  URINALYSIS, ROUTINE W REFLEX MICROSCOPIC  PREGNANCY, URINE  LIPASE, BLOOD  CBC WITH DIFFERENTIAL/PLATELET    EKG   Radiology No results found.  Procedures Procedures (including critical care time)  Medications Ordered in UC Medications  alum & mag hydroxide-simeth (MAALOX/MYLANTA) 200-200-20 MG/5ML suspension 30 mL (30 mLs  Oral Given 06/12/22 1715)    Initial Impression / Assessment and Plan / UC Course  I have reviewed the triage  vital signs and the nursing notes.  Pertinent labs & imaging results that were available during my care of the patient were reviewed by me and considered in my medical decision making (see chart for details).  Patient is a very pleasant, nontoxic appearing 21 year old female here for evaluation of abdominal pain that is not associated with urinary symptoms, GYN symptoms, nausea, vomiting, or diarrhea.  She does have a known umbilical hernia which she states was more protuberant when her pain for started but that has since gone down.  Patient is not in any acute distress and her vital signs are stable.  She is afebrile.  Physical exam reveals a benign cardiopulmonary exam with S1-S2 heart sounds with regular rate and rhythm and lung sounds are clear to auscultation all fields.  No CVA tenderness on exam.  Abdomen is soft, flat, with positive bowel sounds in all 4 quadrants.  Patient does have epigastric tenderness without guarding or rebound as well as mild left upper quadrant tenderness and suprapubic tenderness.  No right lower quadrant or left lower quadrant tenderness on exam.  No guarding or rebound anywhere in the abdomen.  Patient does have a palpable umbilical hernia which is soft and easily reducible.  Urinalysis was collected at triage.  We will check wet prep, CBC, CMP, lipase, and will give GI cocktail as patient states she is having some epigastric abdominal pain at present.  Urinalysis is unremarkable.  Urine pregnancy test is negative.  Vaginal wet prep is positive for clue cells but negative for yeast or trichomonas.  CBC is unremarkable.  CMP shows a mildly decreased sodium of 134 but the remainder of electrolytes, renal function, transaminases are normal.  Lipase is 36.  Patient reports that after the GI cocktail she feels much better.  I do believe she has a mild bit of gastritis that may be contributing to her symptoms.  I will treat her for bacterial vaginosis with metronidazole  twice daily for 7 days.  Work note provided.  Return precautions reviewed.   Final Clinical Impressions(s) / UC Diagnoses   Final diagnoses:  Acute gastritis without hemorrhage, unspecified gastritis type  BV (bacterial vaginosis)     Discharge Instructions      Take the Flagyl (metronidazole) 500 mg twice daily for treatment of your bacterial vaginosis.  Avoid alcohol while on the metronidazole as taken together will cause of vomiting.  Bacterial vaginosis is often caused by a imbalance of bacteria in your vaginal vault.  This is sometimes a result of using tampons or hormonal fluctuations during her menstrual cycle.  You if your symptoms are recurrent you can try using a boric acid suppository twice weekly to help maintain the acid-base balance in your vagina vault which could prevent further infection.  You can also try vaginal probiotics to help return normal bacterial balance.   For your gastritis avoid eating spicy or greasy foods for the next week and allow the inflammation of your stomach to calm down.  I would follow a bland diet.     ED Prescriptions     Medication Sig Dispense Auth. Provider   metroNIDAZOLE (FLAGYL) 500 MG tablet Take 1 tablet (500 mg total) by mouth 2 (two) times daily. 14 tablet Becky Augusta, NP      PDMP not reviewed this encounter.   Becky Augusta, NP 06/12/22 1747

## 2022-08-31 ENCOUNTER — Ambulatory Visit
Admission: EM | Admit: 2022-08-31 | Discharge: 2022-08-31 | Disposition: A | Discharge status: Home / Self Care | Payer: Medicaid Other | Attending: Family Medicine | Admitting: Family Medicine

## 2022-08-31 DIAGNOSIS — H1032 Unspecified acute conjunctivitis, left eye: Secondary | ICD-10-CM

## 2022-08-31 MED ORDER — POLYMYXIN B-TRIMETHOPRIM 10000-0.1 UNIT/ML-% OP SOLN: 2.0000 [drp] | OPHTHALMIC | 0 refills | Status: AC

## 2022-08-31 NOTE — ED Provider Notes (Signed)
[facility]    CSN: [id number] Arrival date & time: 08/31/22  0807      History   Chief Complaint Chief Complaint  Patient presents with   Eye Problem    HPI HPI  Vanessa Holland is a 21 y.o. female.    Vanessa Holland presents for left eye redness that started 2 days ago.  States that she has been waking up with crusting in her eyes.  She went to work but they sent her here.  She works at an assisted living facility.  Feeling something in her eye.  Denies any trauma.  Opened the eye this morning with a warm washcloth. Vanessa Holland has not had any trouble seeing or discharge from the eye.   Vanessa Holland has otherwise been well and has no additional concerns today.   Fever : no  Cough: no Nasal congestion : no  Appetite: normal  Hydration: normal  Vomiting: no Sleep disturbance: no Headache: no   Past Medical History:  Diagnosis Date   Umbilical hernia 1751    Patient Active Problem List   Diagnosis Date Noted   Gestational hypertension 10/01/2020   Unwanted fertility 10/01/2020   Vaginal delivery 09/30/2020   Supervision of normal first teen pregnancy 09/30/2020   Infection of urinary tract in pregnancy 09/30/2020   Low back pain during pregnancy 09/22/2020   Late prenatal care affecting pregnancy 07/18/2020   Encounter for supervision of normal first pregnancy in second trimester 06/28/2020   Attention deficit hyperactivity disorder (ADHD) 01/07/2018    Past Surgical History:  Procedure Laterality Date   NO PAST SURGERIES      OB History     Gravida  1   Para  1   Term  1   Preterm      AB      Living  1      SAB      IAB      Ectopic      Multiple  0   Live Births  1            Home Medications    Prior to Admission medications   Medication Sig Start Date End Date Taking? Authorizing Provider  JUNEL FE 1.5/30 1.5-30 MG-MCG tablet Take 1 tablet by mouth daily. 07/21/22  Yes [provider]  trimethoprim-polymyxin b  (POLYTRIM) ophthalmic solution Place 2 drops into the left eye every 4 (four) hours for 7 days. 08/31/22 09/07/22 Yes Aubreigh Fuerte, DO  etonogestrel (NEXPLANON) 68 MG IMPL implant 1 each by Subdermal route once.    [provider]  metroNIDAZOLE (FLAGYL) 500 MG tablet Take 1 tablet (500 mg total) by mouth 2 (two) times daily. 06/12/22   Margarette Canada, NP  amLODipine (NORVASC) 5 MG tablet Take 1 tablet (5 mg total) by mouth daily. 10/02/20 11/14/20  Randa Ngo, MD    Family History Family History  Problem Relation Age of Onset   Healthy Mother    Healthy Father     Social History Social History   Tobacco Use   Smoking status: Never   Smokeless tobacco: Never  Vaping Use   Vaping Use: Some days  Substance Use Topics   Alcohol use: Never   Drug use: Never     Allergies   Bee pollen and Shrimp [shellfish allergy]   Review of Systems Review of Systems : negative unless otherwise stated in HPI.      Physical Exam Triage Vital Signs ED Triage Vitals [08/31/22 0818]  Enc Vitals Group     BP      Pulse      Resp      Temp      Temp src      SpO2      Weight 150 lb (68 kg)     Height 5\' 3"  (1.6 m)     Head Circumference      Peak Flow      Pain Score 0     Pain Loc      Pain Edu?      Excl. in [place]?    No data found.  Updated Vital Signs BP 128/78 (BP Location: Left Arm)   Pulse 72   Temp 98.6 F (37 C) (Oral)   Ht 5\' 3"  (1.6 m)   Wt 68 kg   SpO2 96%   BMI 26.57 kg/m   Visual Acuity Right Eye Distance: 20/20 (Uncorrected) Left Eye Distance: 20/20 (Uncorrected) Bilateral Distance: 20/20 (Uncorrected)  Right Eye Near:   Left Eye Near:    Bilateral Near:     Physical Exam  GEN: pleasant well appearing female, in no acute distress  NECK: normal ROM  CV: regular rate  RESP: no increased work of breathing,  EYES:     General: Lids are normal. Lids are everted, no foreign bodies appreciated. Vision grossly intact. Gaze aligned appropriately.         Right eye: No discharge.        Left eye: No foreign body, discharge or hordeolum.     Extraocular Movements: Extraocular movements intact.     Conjunctiva/sclera:     Left eye: Left conjunctiva is injected. No chemosis or hemorrhage. SKIN: warm and dry   UC Treatments / Results  Labs (all labs ordered are listed, but only abnormal results are displayed) Labs Reviewed - No data to display  EKG   Radiology No results found.  Procedures Procedures (including critical care time)  Medications Ordered in UC Medications - No data to display  Initial Impression / Assessment and Plan / UC Course  I have reviewed the triage vital signs and the nursing notes.  Pertinent labs & imaging results that were available during my care of the patient were reviewed by me and considered in my medical decision making (see chart for details).     Patient is a 21 y.o. female who presents after acute onset left eye redness for the past  2 days.  On exam, she has left eye injection.  Sent Polytrim drops to the pharmacy.  Advised to follow-up with an ophthalmologist or optometrist, if  discomfort/pain is not improving after 7-day course. Handout provided.  Patient voiced understanding.  Discussed MDM, treatment plan and plan for follow-up with patient/parent who agrees with plan.  Final Clinical Impressions(s) / UC Diagnoses   Final diagnoses:  Acute bacterial conjunctivitis of left eye     Discharge Instructions      Stop by the pharmacy to pick up your prescriptions.  Follow up with your primary care provider as needed.      ED Prescriptions     Medication Sig Dispense Auth. Provider   trimethoprim-polymyxin b (POLYTRIM) ophthalmic solution Place 2 drops into the left eye every 4 (four) hours for 7 days. 10 mL , DO      PDMP not reviewed this encounter.   26, DO 08/31/22 [phone]

## 2022-08-31 NOTE — ED Triage Notes (Addendum)
Pt c/o LT eye watery, matted x2 days. Pt denies any pain but states eye is irritated. Pt also needing for work note

## 2022-08-31 NOTE — Discharge Instructions (Addendum)
Stop by the pharmacy to pick up your prescriptions.  Follow up with your primary care provider as needed.  

## 2022-11-28 ENCOUNTER — Ambulatory Visit
Admission: EM | Admit: 2022-11-28 | Discharge: 2022-11-28 | Disposition: A | Discharge status: Home / Self Care | Payer: Medicaid Other

## 2022-11-28 DIAGNOSIS — R051 Acute cough: Secondary | ICD-10-CM | POA: Diagnosis not present

## 2022-11-28 DIAGNOSIS — J069 Acute upper respiratory infection, unspecified: Secondary | ICD-10-CM

## 2022-11-28 DIAGNOSIS — R0981 Nasal congestion: Secondary | ICD-10-CM

## 2022-11-28 NOTE — Discharge Instructions (Addendum)
URI/COLD SYMPTOMS: Your exam today is consistent with a viral illness. Antibiotics are not indicated at this time. Use medications as directed, including cough syrup, nasal saline, and decongestants. Your symptoms should improve over the next few days and resolve within 7-10 days. Increase rest and fluids. F/u if symptoms worsen or predominate such as sore throat, ear pain, productive cough, shortness of breath, or if you develop high fevers or worsening fatigue over the next several days.    

## 2022-11-28 NOTE — ED Provider Notes (Signed)
[facility]    CSN: [id number] Arrival date & time: 11/28/22  0909      History   Chief Complaint Chief Complaint  Patient presents with   Sore Throat   Nasal Congestion    HPI Vanessa Holland is a 21 y.o. female presenting for cough, congestion, left-sided ear pain that is intermittent for the past 2 to 3 days.  Patient reports sore throat at onset but says that has resolved.  She denies fever, fatigue, body aches, breathing difficulty, vomiting or diarrhea.  Reports she has been around 2 people with similar symptoms.  No known COVID, flu or strep exposure.  She did take a COVID test today and says it was negative.  She has been taking Mucinex and Benadryl without much relief.  She requests a note for work.  No other complaints.  HPI  Past Medical History:  Diagnosis Date   Umbilical hernia 2020    Patient Active Problem List   Diagnosis Date Noted   Gestational hypertension 10/01/2020   Unwanted fertility 10/01/2020   Vaginal delivery 09/30/2020   Supervision of normal first teen pregnancy 09/30/2020   Infection of urinary tract in pregnancy 09/30/2020   Low back pain during pregnancy 09/22/2020   Late prenatal care affecting pregnancy 07/18/2020   Encounter for supervision of normal first pregnancy in second trimester 06/28/2020   Attention deficit hyperactivity disorder (ADHD) 01/07/2018    Past Surgical History:  Procedure Laterality Date   NO PAST SURGERIES      OB History     Gravida  1   Para  1   Term  1   Preterm      AB      Living  1      SAB      IAB      Ectopic      Multiple  0   Live Births  1            Home Medications    Prior to Admission medications   Medication Sig Start Date End Date Taking? Authorizing Provider  etonogestrel (NEXPLANON) 68 MG IMPL implant 1 each by Subdermal route once.    [provider]  JUNEL FE 1.5/30 1.5-30 MG-MCG tablet Take 1 tablet by mouth daily. 07/21/22    [provider]  metroNIDAZOLE (FLAGYL) 500 MG tablet Take 1 tablet (500 mg total) by mouth 2 (two) times daily. 06/12/22   Becky Augusta, NP  amLODipine (NORVASC) 5 MG tablet Take 1 tablet (5 mg total) by mouth daily. 10/02/20 11/14/20  Sheila Oats, MD    Family History Family History  Problem Relation Age of Onset   Healthy Mother    Healthy Father     Social History Social History   Tobacco Use   Smoking status: Never   Smokeless tobacco: Never  Vaping Use   Vaping Use: Some days  Substance Use Topics   Alcohol use: Never   Drug use: Never     Allergies   Bee pollen and Shrimp [shellfish allergy]   Review of Systems Review of Systems  Constitutional:  Negative for chills, diaphoresis, fatigue and fever.  HENT:  Positive for congestion, ear pain and rhinorrhea. Negative for sinus pressure, sinus pain and sore throat.   Respiratory:  Positive for cough. Negative for shortness of breath.   Gastrointestinal:  Negative for abdominal pain, nausea and vomiting.  Musculoskeletal:  Negative for arthralgias and myalgias.  Skin:  Negative for rash.  Neurological:  Negative for weakness and headaches.  Hematological:  Negative for adenopathy.     Physical Exam Triage Vital Signs ED Triage Vitals [11/28/22 1019]  Enc Vitals Group     BP      Pulse      Resp      Temp      Temp src      SpO2      Weight      Height      Head Circumference      Peak Flow      Pain Score 3     Pain Loc      Pain Edu?      Excl. in [place]?    No data found.  Updated Vital Signs BP (!) 99/52 (BP Location: Right Arm)   Pulse 83   Temp 98.2 F (36.8 C) (Oral)   Resp 16   Ht 5\' 3"  (1.6 m)   Wt 150 lb (68 kg)   LMP 11/27/2022 (Exact Date)   SpO2 98%   BMI 26.57 kg/m   Physical Exam Vitals and nursing note reviewed.  Constitutional:      General: She is not in acute distress.    Appearance: Normal appearance. She is not ill-appearing or toxic-appearing.  HENT:      Head: Normocephalic and atraumatic.     Right Ear: Tympanic membrane, ear canal and external ear normal.     Left Ear: External ear normal. There is impacted cerumen.     Nose: Congestion present.     Mouth/Throat:     Mouth: Mucous membranes are moist.     Pharynx: Oropharynx is clear. No posterior oropharyngeal erythema.  Eyes:     General: No scleral icterus.       Right eye: No discharge.        Left eye: No discharge.     Conjunctiva/sclera: Conjunctivae normal.  Cardiovascular:     Rate and Rhythm: Normal rate and regular rhythm.     Heart sounds: Normal heart sounds.  Pulmonary:     Effort: Pulmonary effort is normal. No respiratory distress.     Breath sounds: Normal breath sounds.  Musculoskeletal:     Cervical back: Neck supple.  Skin:    General: Skin is dry.  Neurological:     General: No focal deficit present.     Mental Status: She is alert. Mental status is at baseline.     Motor: No weakness.     Gait: Gait normal.  Psychiatric:        Mood and Affect: Mood normal.        Behavior: Behavior normal.        Thought Content: Thought content normal.      UC Treatments / Results  Labs (all labs ordered are listed, but only abnormal results are displayed) Labs Reviewed - No data to display  EKG   Radiology No results found.  Procedures Procedures (including critical care time)  Medications Ordered in UC Medications - No data to display  Initial Impression / Assessment and Plan / UC Course  I have reviewed the triage vital signs and the nursing notes.  Pertinent labs & imaging results that were available during my care of the patient were reviewed by me and considered in my medical decision making (see chart for details).   21 year old female presenting for 2 to 3-day history of cough and congestion.  Reports intermittent left-sided ear pain.  Report sore throat at  onset which has resolved.  No fever.  Negative at-home COVID test today.  Declines  repeat testing.  She is afebrile and overall well-appearing.  On exam she has impacted cerumen of the left EAC.  Unable to visualize TM.  Normal-appearing TM on the right.  Mild nasal congestion.  Throat is clear.  Chest clear auscultation.  Offered otic lavage for the left but patient declines.  States she will take over-the-counter medication to try to flush the ear herself.  Advised her she has a viral URI.  Supportive care encouraged.  Advised over-the-counter Mucinex, rest and fluids, Motrin.  Work note provided.  Follow-up as needed.   Final Clinical Impressions(s) / UC Diagnoses   Final diagnoses:  Viral upper respiratory tract infection  Nasal congestion  Acute cough     Discharge Instructions      URI/COLD SYMPTOMS: Your exam today is consistent with a viral illness. Antibiotics are not indicated at this time. Use medications as directed, including cough syrup, nasal saline, and decongestants. Your symptoms should improve over the next few days and resolve within 7-10 days. Increase rest and fluids. F/u if symptoms worsen or predominate such as sore throat, ear pain, productive cough, shortness of breath, or if you develop high fevers or worsening fatigue over the next several days.       ED Prescriptions   None    PDMP not reviewed this encounter.   Shirlee Latch, PA-C 11/28/22 1052

## 2022-11-28 NOTE — ED Triage Notes (Addendum)
Chief Complaint: sore throat, nasal congestion, and ear pain in the left. Productive cough with yellow mucus with some blood. No chills, body aches, or fever. Sore throat has gotten better.   Onset: 2-3 days   Prescriptions or OTC medications tried: Yes- Mucinex, benadryl     with little relief  Sick exposure: Yes- no positive testing that Patient knows of   New foods, medications, or products: No  Recent Travel: No

## 2022-12-30 ENCOUNTER — Ambulatory Visit
Admission: EM | Admit: 2022-12-30 | Discharge: 2022-12-30 | Disposition: A | Discharge status: Home / Self Care | Payer: Medicaid Other | Attending: Emergency Medicine | Admitting: Emergency Medicine

## 2022-12-30 DIAGNOSIS — B9689 Other specified bacterial agents as the cause of diseases classified elsewhere: Secondary | ICD-10-CM | POA: Diagnosis not present

## 2022-12-30 DIAGNOSIS — N760 Acute vaginitis: Secondary | ICD-10-CM | POA: Insufficient documentation

## 2022-12-30 LAB — WET PREP, GENITAL
Sperm: NONE SEEN
Trich, Wet Prep: NONE SEEN
WBC, Wet Prep HPF POC: >10 — AB (ref <10)
Yeast Wet Prep HPF POC: NONE SEEN

## 2022-12-30 LAB — CBC WITH DIFFERENTIAL/PLATELET
Abs Immature Granulocytes: 0.03 10*3/uL (ref 0.00–0.07)
Basophils Absolute: 0.1 10*3/uL (ref 0.0–0.1)
Basophils Relative: 1 %
Eosinophils Absolute: 0.1 10*3/uL (ref 0.0–0.5)
Eosinophils Relative: 1 %
HCT: 37.3 % (ref 36.0–46.0)
Hemoglobin: 12.5 g/dL (ref 12.0–15.0)
Immature Granulocytes: 0 %
Lymphocytes Relative: 34 %
Lymphs Abs: 3.3 10*3/uL (ref 0.7–4.0)
MCH: 29.3 pg (ref 26.0–34.0)
MCHC: 33.5 g/dL (ref 30.0–36.0)
MCV: 87.4 fL (ref 80.0–100.0)
Monocytes Absolute: 0.6 10*3/uL (ref 0.1–1.0)
Monocytes Relative: 7 %
Neutro Abs: 5.7 10*3/uL (ref 1.7–7.7)
Neutrophils Relative %: 57 %
Platelets: 318 10*3/uL (ref 150–400)
RBC: 4.27 MIL/uL (ref 3.87–5.11)
RDW: 13.7 % (ref 11.5–15.5)
WBC: 9.8 10*3/uL (ref 4.0–10.5)
nRBC: 0 % (ref 0.0–0.2)

## 2022-12-30 LAB — PREGNANCY, URINE: Preg Test, Ur: NEGATIVE

## 2022-12-30 MED ORDER — METRONIDAZOLE 500 MG PO TABS: 500.0000 mg | ORAL_TABLET | Freq: Two times a day (BID) | ORAL | 0 refills | Status: DC

## 2022-12-30 NOTE — ED Provider Notes (Signed)
[facility]    CSN: [id number] Arrival date & time: 12/30/22  1550      History   Chief Complaint No chief complaint on file.   HPI Jalesa Sawin is a 22 y.o. female.   HPI  22 year old female here for evaluation of abnormal menstrual cycle.  The patient reports that she started her menstrual cycle 3 days ago and that she has been bleeding heavier than normal and passing clots.  She states that the bleeding and the clots have subsided today but she is concerned because she has never had this heavy period before.  She states that typically her menstrual cycle is irregular and only last for 3 days in duration.  She was recently started on birth control but stopped the beginning of December because she did not like how it made her feel.  She has an upcoming appointment with GYN to discuss getting started on Depo-Provera.  She denies any dizziness, syncope, or vaginal discharge.  She is sexually active but she denies any possibility of pregnancy.  Her last menstrual cycle before this 1 was just before New Year's but patient is unsure of the exact date.  Past Medical History:  Diagnosis Date   Umbilical hernia 2020    Patient Active Problem List   Diagnosis Date Noted   Gestational hypertension 10/01/2020   Unwanted fertility 10/01/2020   Vaginal delivery 09/30/2020   Supervision of normal first teen pregnancy 09/30/2020   Infection of urinary tract in pregnancy 09/30/2020   Low back pain during pregnancy 09/22/2020   Late prenatal care affecting pregnancy 07/18/2020   Encounter for supervision of normal first pregnancy in second trimester 06/28/2020   Attention deficit hyperactivity disorder (ADHD) 01/07/2018    Past Surgical History:  Procedure Laterality Date   NO PAST SURGERIES      OB History     Gravida  1   Para  1   Term  1   Preterm      AB      Living  1      SAB      IAB      Ectopic      Multiple  0   Live Births  1             Home Medications    Prior to Admission medications   Medication Sig Start Date End Date Taking? Authorizing Provider  metroNIDAZOLE (FLAGYL) 500 MG tablet Take 1 tablet (500 mg total) by mouth 2 (two) times daily. 12/30/22  Yes Becky Augusta, NP  etonogestrel (NEXPLANON) 68 MG IMPL implant 1 each by Subdermal route once.    [provider]  JUNEL FE 1.5/30 1.5-30 MG-MCG tablet Take 1 tablet by mouth daily. 07/21/22   [provider]  amLODipine (NORVASC) 5 MG tablet Take 1 tablet (5 mg total) by mouth daily. 10/02/20 11/14/20  Sheila Oats, MD    Family History Family History  Problem Relation Age of Onset   Healthy Mother    Healthy Father     Social History Social History   Tobacco Use   Smoking status: Never   Smokeless tobacco: Never  Vaping Use   Vaping Use: Some days  Substance Use Topics   Alcohol use: Never   Drug use: Never     Allergies   Bee pollen and Shrimp [shellfish allergy]   Review of Systems Review of Systems  Constitutional:  Negative for fever.  Genitourinary:  Positive for vaginal bleeding.  Negative for dysuria, frequency, urgency, vaginal discharge and vaginal pain.  Neurological:  Negative for dizziness and syncope.     Physical Exam Triage Vital Signs ED Triage Vitals  Enc Vitals Group     BP 12/30/22 1620 116/74     Pulse Rate 12/30/22 1620 84     Resp --      Temp 12/30/22 1620 98.9 F (37.2 C)     Temp Source 12/30/22 1620 Oral     SpO2 12/30/22 1620 100 %     Weight 12/30/22 1619 148 lb (67.1 kg)     Height 12/30/22 1619 5\' 2"  (1.575 m)     Head Circumference --      Peak Flow --      Pain Score --      Pain Loc --      Pain Edu? --      Excl. in [place]? --    No data found.  Updated Vital Signs BP 116/74 (BP Location: Left Arm)   Pulse 84   Temp 98.9 F (37.2 C) (Oral)   Ht 5\' 2"  (1.575 m)   Wt 148 lb (67.1 kg)   LMP 12/28/2022 (Exact Date)   SpO2 100%   BMI 27.07 kg/m   Visual Acuity Right  Eye Distance:   Left Eye Distance:   Bilateral Distance:    Right Eye Near:   Left Eye Near:    Bilateral Near:     Physical Exam Vitals and nursing note reviewed.  Constitutional:      Appearance: Normal appearance. She is not ill-appearing.  HENT:     Head: Normocephalic and atraumatic.  Cardiovascular:     Rate and Rhythm: Normal rate and regular rhythm.     Pulses: Normal pulses.     Heart sounds: Normal heart sounds. No murmur heard.    No friction rub. No gallop.  Pulmonary:     Effort: Pulmonary effort is normal.     Breath sounds: Normal breath sounds. No wheezing, rhonchi or rales.  Abdominal:     General: Abdomen is flat.     Palpations: Abdomen is soft.     Tenderness: There is abdominal tenderness. There is no guarding or rebound.     Comments: Mild upper abdominal tenderness without any focal findings.  No guarding or rebound.  Skin:    General: Skin is warm and dry.     Capillary Refill: Capillary refill takes less than 2 seconds.     Coloration: Skin is not pale.     Findings: No erythema.  Neurological:     General: No focal deficit present.     Mental Status: She is alert and oriented to person, place, and time.  Psychiatric:        Mood and Affect: Mood normal.        Behavior: Behavior normal.        Thought Content: Thought content normal.        Judgment: Judgment normal.      UC Treatments / Results  Labs (all labs ordered are listed, but only abnormal results are displayed) Labs Reviewed  WET PREP, GENITAL - Abnormal; Notable for the following components:      Result Value   Clue Cells Wet Prep HPF POC PRESENT (*)    WBC, Wet Prep HPF POC >10 (*)    All other components within normal limits  CBC WITH DIFFERENTIAL/PLATELET  PREGNANCY, URINE    EKG   Radiology No results found.  Procedures Procedures (including critical care time)  Medications Ordered in UC Medications - No data to display  Initial Impression / Assessment and  Plan / UC Course  I have reviewed the triage vital signs and the nursing notes.  Pertinent labs & imaging results that were available during my care of the patient were reviewed by me and considered in my medical decision making (see chart for details).   Patient is a nontoxic-appearing 22 year old female here for evaluation of heavier menstrual cycle with clots that has been going on for last 3 days.  She states that she was concerned because her menstrual flow has never been as heavy as it is this cycle and she has not passed clots before.  She states that both the flow rate and the clots have slowed down today but are still present.  She reports a history of irregular periods and states that her last menstrual cycle before this 1 was just before New Year's.  She states typically her cycles last approximately 3 days.  She was recently on birth control but went off of it due to not liking how it made her feel.  She has an appointment coming up with GYN to discuss starting on Depo-Provera.  She has not had any dizziness or fainting.  She denies any vaginal discharge.  She states that she is sexually active but she denies any possibility of pregnancy.  She has some mild upper abdominal tenderness but no lower or suprapubic tenderness on exam.  No guarding or rebound in her abdomen.  No pallor.  Her nailbeds are pink and her cap refill is less than 2 seconds.  I discussed that the hormone fluctuations due to coming off birth control could be causing her abnormally heavy menstrual cycle as well as possible vaginal affection such as bacterial vaginosis.  Other possibility is pregnancy miscarriage.  I will order a urine pregnancy test, vaginal wet prep, and also CBC look for any signs of anemia.  CBC is unremarkable.  H&H is 12.5 and 37.3.  Platelets are 318.  Your pregnancy test is negative.  Vaginal wet prep is positive for clue cells but negative for yeast or trichomonas.  I will diagnose the patient with  bacterial vaginosis and treat her with metronidazole 500 mg twice daily for 7 days.  I called and gave the patient the information as she had to leave to pick up her daughter before her labs were back.  I verified her name and date of birth prior to discussing her lab results.   Final Clinical Impressions(s) / UC Diagnoses   Final diagnoses:  BV (bacterial vaginosis)     Discharge Instructions      Take the Flagyl (metronidazole) 500 mg twice daily for treatment of your bacterial vaginosis.  Avoid alcohol while on the metronidazole as taken together will cause of vomiting.  Bacterial vaginosis is often caused by a imbalance of bacteria in your vaginal vault.  This is sometimes a result of using tampons or hormonal fluctuations during her menstrual cycle.  You if your symptoms are recurrent you can try using a boric acid suppository twice weekly to help maintain the acid-base balance in your vagina vault which could prevent further infection.  You can also try vaginal probiotics to help return normal bacterial balance.      ED Prescriptions     Medication Sig Dispense Auth. Provider   metroNIDAZOLE (FLAGYL) 500 MG tablet Take 1 tablet (500 mg total) by mouth 2 (two) times  daily. 14 tablet Margarette Canada, NP      PDMP not reviewed this encounter.   Margarette Canada, NP 12/30/22 1727

## 2022-12-30 NOTE — Discharge Instructions (Addendum)

## 2022-12-30 NOTE — ED Triage Notes (Addendum)
Pt states she started her period on Sunday, pt states period has never been this heavy, pt states she noticed some clots as well but has now subsided.

## 2023-01-30 ENCOUNTER — Ambulatory Visit
Admission: EM | Admit: 2023-01-30 | Discharge: 2023-01-30 | Disposition: A | Discharge status: Home / Self Care | Payer: Medicaid Other | Attending: Emergency Medicine | Admitting: Emergency Medicine

## 2023-01-30 DIAGNOSIS — J020 Streptococcal pharyngitis: Secondary | ICD-10-CM | POA: Insufficient documentation

## 2023-01-30 LAB — GROUP A STREP BY PCR: Group A Strep by PCR: DETECTED — AB

## 2023-01-30 MED ORDER — AMOXICILLIN-POT CLAVULANATE 875-125 MG PO TABS: 1.0000 | ORAL_TABLET | Freq: Two times a day (BID) | ORAL | 0 refills | Status: AC

## 2023-01-30 NOTE — ED Provider Notes (Signed)
[facility]    CSN: [id number] Arrival date & time: 01/30/23  1232      History   Chief Complaint Chief Complaint  Patient presents with   Sore Throat    HPI Vanessa Holland is a 22 y.o. female.   HPI  22 year old female here for evaluation of sore throat.  The patient reports that she has been experiencing a sore throat for the last 2 days but it got especially worse last night.  This is associated with a fever of 105 per her report.  She also endorses some mild nasal congestion but no runny nose or cough.  Her sister recently had strep as well.  Past Medical History:  Diagnosis Date   Umbilical hernia [id number]    Patient Active Problem List   Diagnosis Date Noted   Gestational hypertension 10/01/2020   Unwanted fertility 10/01/2020   Vaginal delivery 09/30/2020   Supervision of normal first teen pregnancy 09/30/2020   Infection of urinary tract in pregnancy 09/30/2020   Low back pain during pregnancy 09/22/2020   Late prenatal care affecting pregnancy 07/18/2020   Encounter for supervision of normal first pregnancy in second trimester 06/28/2020   Attention deficit hyperactivity disorder (ADHD) 01/07/2018    Past Surgical History:  Procedure Laterality Date   NO PAST SURGERIES      OB History     Gravida  1   Para  1   Term  1   Preterm      AB      Living  1      SAB      IAB      Ectopic      Multiple  0   Live Births  1            Home Medications    Prior to Admission medications   Medication Sig Start Date End Date Taking? Authorizing Provider  amoxicillin-clavulanate (AUGMENTIN) 875-125 MG tablet Take 1 tablet by mouth every 12 (twelve) hours for 10 days. 01/30/23 02/09/23 Yes Margarette Canada, NP  amLODipine (NORVASC) 5 MG tablet Take 1 tablet (5 mg total) by mouth daily. 10/02/20 11/14/20  Randa Ngo, MD    Family History Family History  Problem Relation Age of Onset   Healthy Mother    Healthy Father      Social History Social History   Tobacco Use   Smoking status: Never   Smokeless tobacco: Never  Vaping Use   Vaping Use: Some days  Substance Use Topics   Alcohol use: Never   Drug use: Never     Allergies   Bee pollen and Shrimp [shellfish allergy]   Review of Systems Review of Systems  Constitutional:  Positive for fever.  HENT:  Positive for congestion and sore throat. Negative for ear pain and rhinorrhea.   Respiratory:  Negative for cough.      Physical Exam Triage Vital Signs ED Triage Vitals  Enc Vitals Group     BP 01/30/23 1249 106/76     Pulse Rate 01/30/23 1249 92     Resp 01/30/23 1249 16     Temp 01/30/23 1249 (!) 100.5 F (38.1 C)     Temp Source 01/30/23 1249 Oral     SpO2 01/30/23 1249 99 %     Weight 01/30/23 1248 140 lb (63.5 kg)     Height 01/30/23 1248 '5\' 3"'$  (1.6 m)     Head Circumference --      Peak Flow --  Pain Score 01/30/23 1248 10     Pain Loc --      Pain Edu? --      Excl. in [place]? --    No data found.  Updated Vital Signs BP 106/76 (BP Location: Left Arm)   Pulse 92   Temp (!) 100.5 F (38.1 C) (Oral)   Resp 16   Ht '5\' 3"'$  (1.6 m)   Wt 140 lb (63.5 kg)   SpO2 99%   BMI 24.80 kg/m   Visual Acuity Right Eye Distance:   Left Eye Distance:   Bilateral Distance:    Right Eye Near:   Left Eye Near:    Bilateral Near:     Physical Exam Vitals and nursing note reviewed.  Constitutional:      Appearance: Normal appearance. She is not ill-appearing.  HENT:     Head: Normocephalic and atraumatic.     Right Ear: Tympanic membrane, ear canal and external ear normal. There is no impacted cerumen.     Left Ear: Tympanic membrane, ear canal and external ear normal. There is no impacted cerumen.     Nose: Congestion and rhinorrhea present.     Comments: Nasal mucosa is mildly erythematous, edematous, with clear rhinorrhea in both nares.    Mouth/Throat:     Mouth: Mucous membranes are moist.     Pharynx:  Oropharyngeal exudate and posterior oropharyngeal erythema present.     Comments: Bilateral tonsillar pillars are 2+ edematous and erythematous with white exudate. Neck:     Comments: Bilateral tender anterior cervical lymphadenopathy. Cardiovascular:     Rate and Rhythm: Normal rate and regular rhythm.     Pulses: Normal pulses.     Heart sounds: Normal heart sounds. No murmur heard.    No friction rub. No gallop.  Pulmonary:     Effort: Pulmonary effort is normal.     Breath sounds: Normal breath sounds. No wheezing, rhonchi or rales.  Musculoskeletal:     Cervical back: Normal range of motion and neck supple. Tenderness present.  Lymphadenopathy:     Cervical: Cervical adenopathy present.  Skin:    General: Skin is warm and dry.     Capillary Refill: Capillary refill takes less than 2 seconds.     Findings: No erythema or rash.  Neurological:     General: No focal deficit present.     Mental Status: She is alert and oriented to person, place, and time.  Psychiatric:        Mood and Affect: Mood normal.        Behavior: Behavior normal.        Thought Content: Thought content normal.        Judgment: Judgment normal.      UC Treatments / Results  Labs (all labs ordered are listed, but only abnormal results are displayed) Labs Reviewed  GROUP A STREP BY PCR - Abnormal; Notable for the following components:      Result Value   Group A Strep by PCR DETECTED (*)    All other components within normal limits    EKG   Radiology No results found.  Procedures Procedures (including critical care time)  Medications Ordered in UC Medications - No data to display  Initial Impression / Assessment and Plan / UC Course  I have reviewed the triage vital signs and the nursing notes.  Pertinent labs & imaging results that were available during my care of the patient were reviewed by me and considered  in my medical decision making (see chart for details).   Patient is a  nontoxic-appearing 22 year old female who has a significant past medical history for ADHD and gestational hypertension presenting for evaluation of a 2-day history of sore throat with worsening last night and associated fever of 105.  She does have erythematous edematous tonsillar pillars with white exudate bilaterally.  Her anterior cervical lymph chain is also inflamed and tender.  Cardiopulmonary exam is benign.  Her sister recently had strep and she was exposed.  I will order a strep PCR.  Strep PCR is positive.  Discharge patient home on Augmentin 875 mg twice daily for 10 days for treatment of her strep throat.  Supportive care instructions provided.  Work note provided.  Final Clinical Impressions(s) / UC Diagnoses   Final diagnoses:  Strep pharyngitis     Discharge Instructions      Take the Augmentin twice daily for 10 days for treatment of your strep throat.  Gargle with warm salt water 2-3 times a day to soothe your throat, aid in pain relief, and aid in healing.  Take over-the-counter ibuprofen according to the package instructions as needed for pain.  You can also use Chloraseptic or Sucrets lozenges, 1 lozenge every 2 hours as needed for throat pain.  If you develop any new or worsening symptoms return for reevaluation.      ED Prescriptions     Medication Sig Dispense Auth. Provider   amoxicillin-clavulanate (AUGMENTIN) 875-125 MG tablet Take 1 tablet by mouth every 12 (twelve) hours for 10 days. 20 tablet Margarette Canada, NP      PDMP not reviewed this encounter.   Margarette Canada, NP 01/30/23 1323

## 2023-01-30 NOTE — ED Triage Notes (Signed)
Pt c/o sore throat x 1 day

## 2023-01-30 NOTE — Discharge Instructions (Signed)
Take the Augmentin twice daily for 10 days for treatment of your strep throat.  Gargle with warm salt water 2-3 times a day to soothe your throat, aid in pain relief, and aid in healing.  Take over-the-counter ibuprofen according to the package instructions as needed for pain.  You can also use Chloraseptic or Sucrets lozenges, 1 lozenge every 2 hours as needed for throat pain.  If you develop any new or worsening symptoms return for reevaluation.

## 2023-06-13 IMAGING — CR DG ABDOMEN 2V
2 series · 2 of 2 positions shown · non-contrast
Comparison: None.

CLINICAL DATA: Constipation

EXAM:
ABDOMEN - 2 VIEW

[abdomen erect]
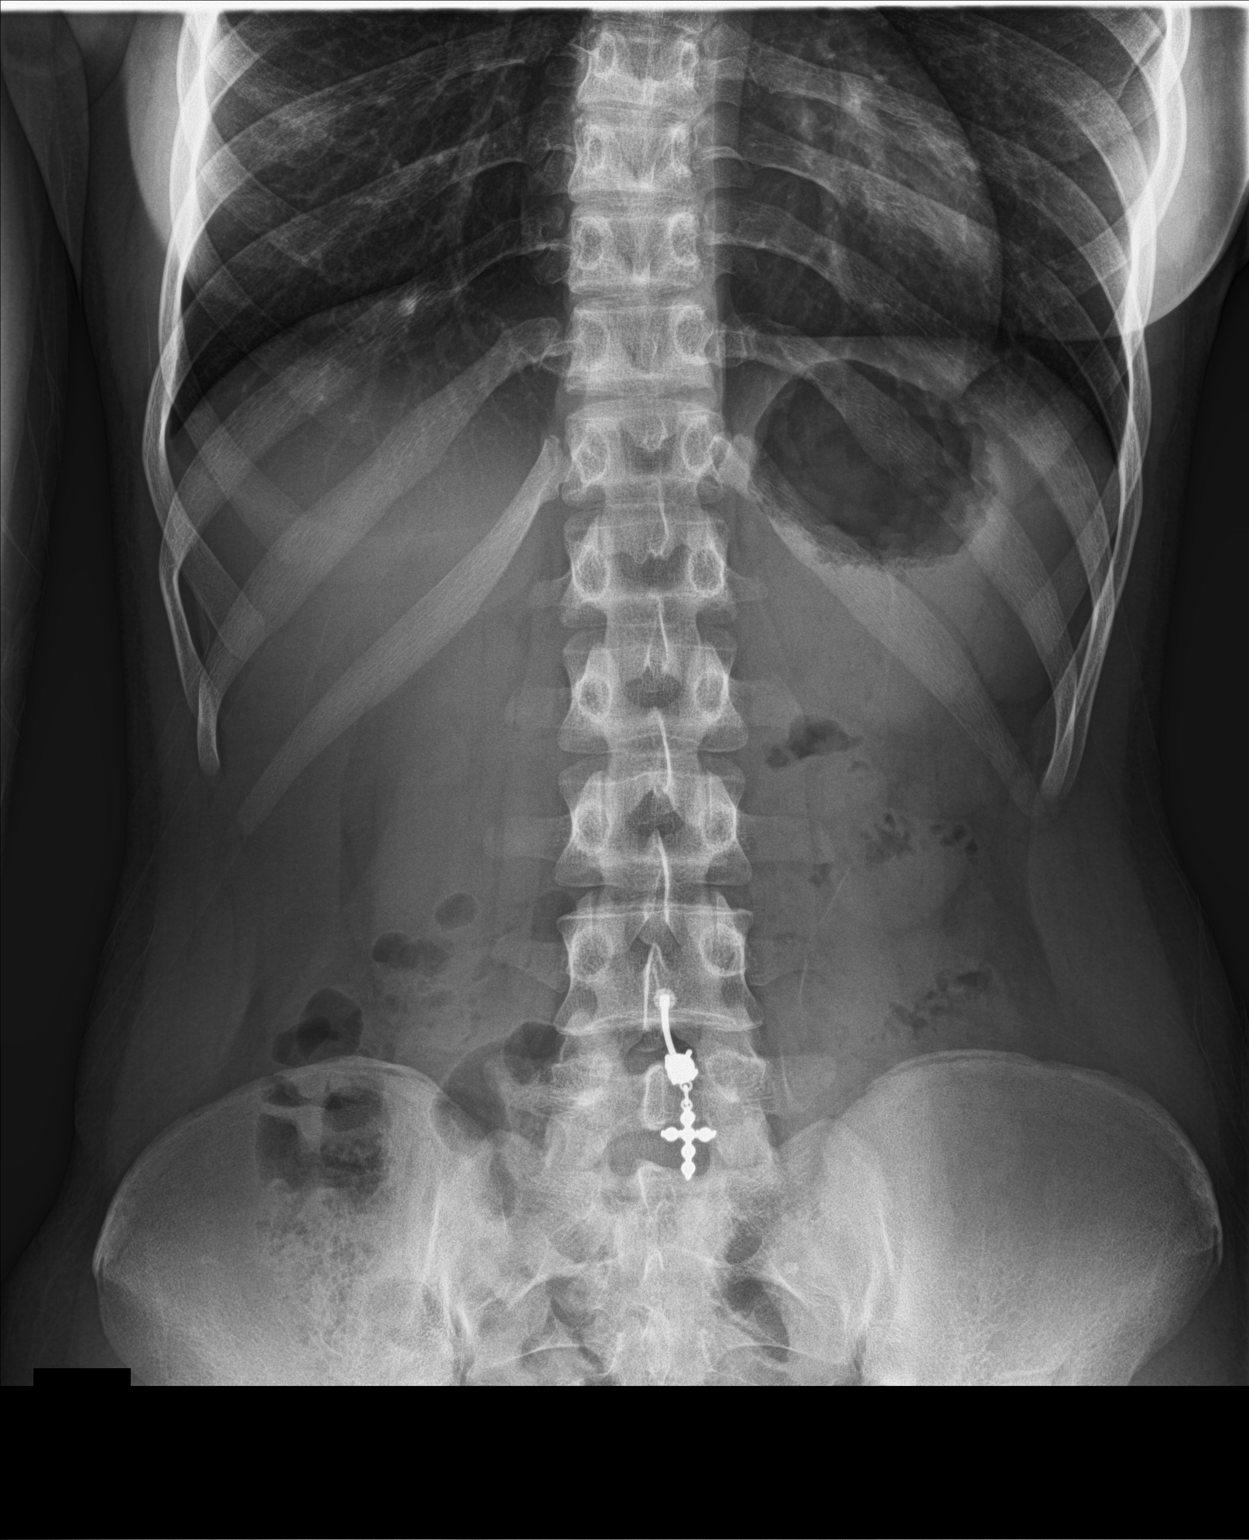

[abdomen supine]
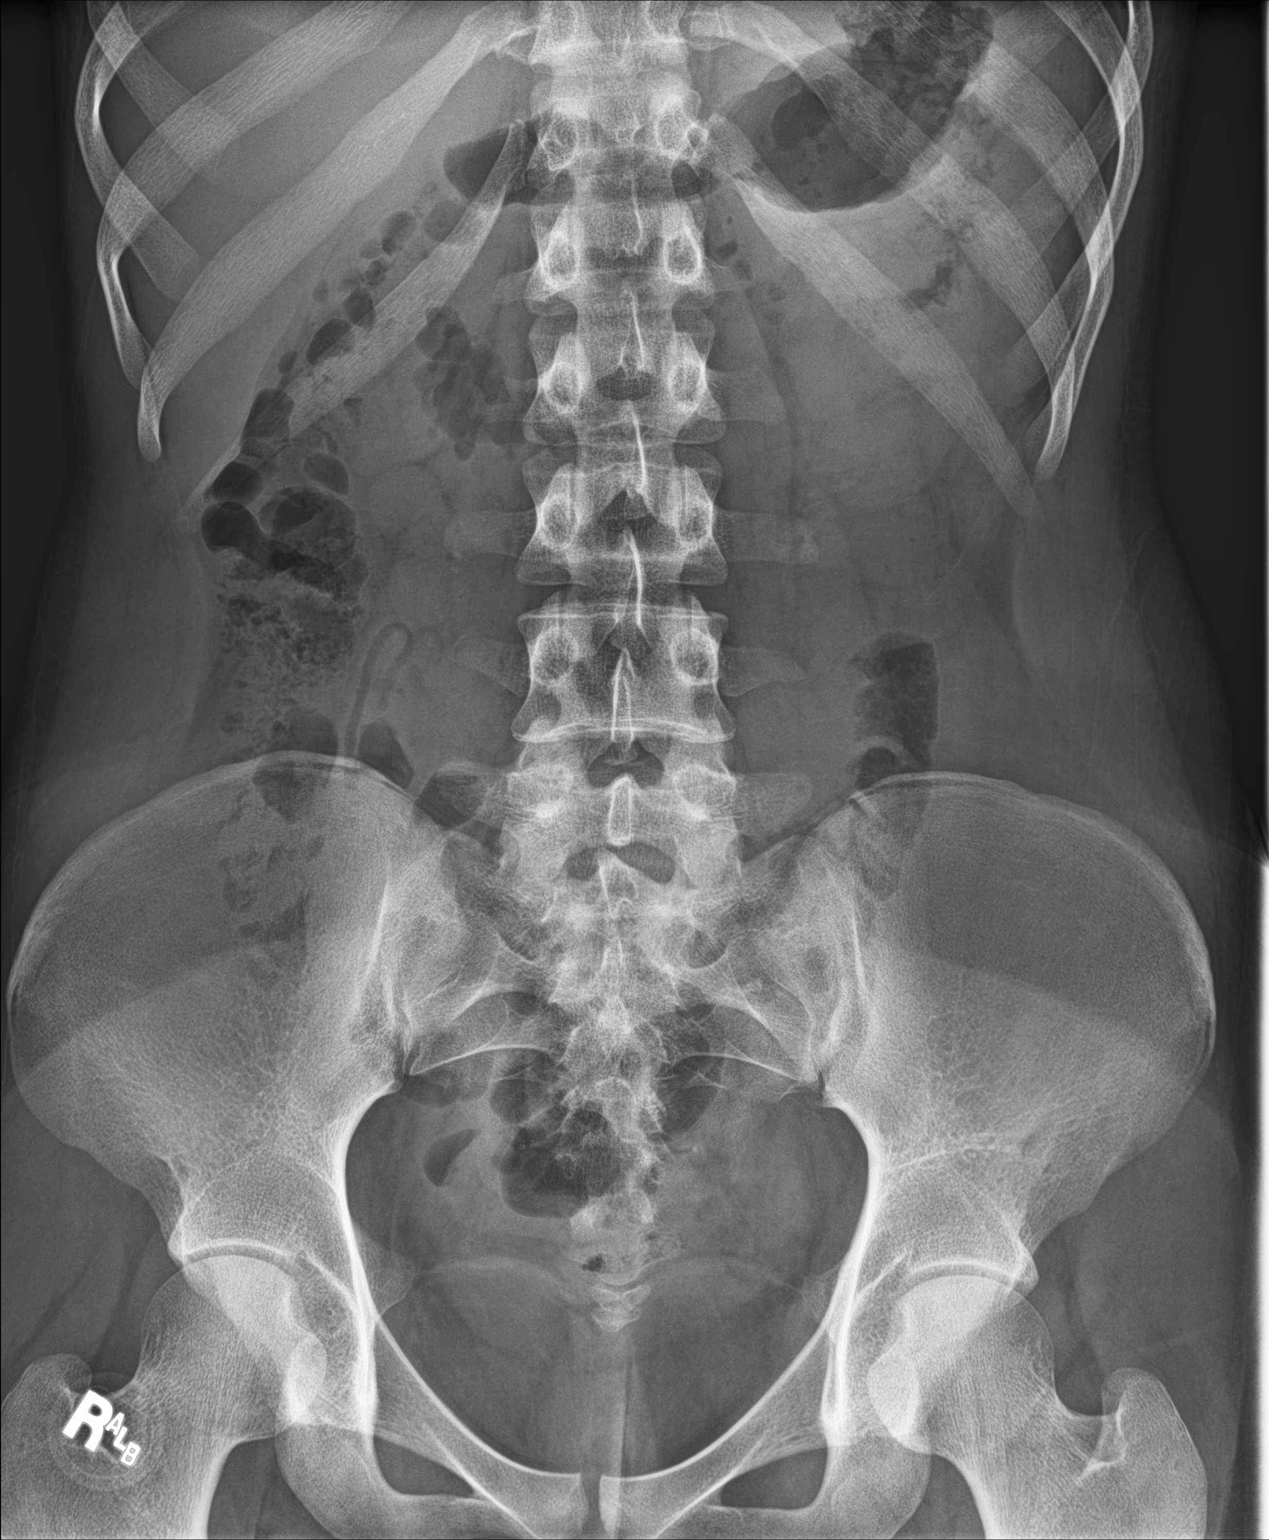

[2 of 2 positions shown; findings below may reference images not displayed]

FINDINGS: The bowel gas pattern is normal. There is no evidence of free air.
No radio-opaque calculi or other significant radiographic
abnormality is seen.
IMPRESSION: Normal abdominal radiographs.

## 2023-07-01 ENCOUNTER — Emergency Department
Admission: EM | Admit: 2023-07-01 | Discharge: 2023-07-01 | Disposition: A | Discharge status: Home / Self Care | Payer: Medicaid Other | Attending: Emergency Medicine | Admitting: Emergency Medicine

## 2023-07-01 ENCOUNTER — Other Ambulatory Visit: Payer: Self-pay

## 2023-07-01 DIAGNOSIS — R109 Unspecified abdominal pain: Secondary | ICD-10-CM

## 2023-07-01 DIAGNOSIS — R1030 Lower abdominal pain, unspecified: Secondary | ICD-10-CM | POA: Diagnosis not present

## 2023-07-01 LAB — URINALYSIS, ROUTINE W REFLEX MICROSCOPIC
Bilirubin Urine: NEGATIVE
Glucose, UA: NEGATIVE mg/dL
Hgb urine dipstick: NEGATIVE
Ketones, ur: NEGATIVE mg/dL
Leukocytes,Ua: NEGATIVE
Nitrite: NEGATIVE
Protein, ur: NEGATIVE mg/dL
Specific Gravity, Urine: 1.008 (ref 1.005–1.030)
pH: 6 (ref 5.0–8.0)

## 2023-07-01 LAB — CHLAMYDIA/NGC RT PCR (ARMC ONLY)
Chlamydia Tr: NOT DETECTED
N gonorrhoeae: NOT DETECTED

## 2023-07-01 LAB — WET PREP, GENITAL
Clue Cells Wet Prep HPF POC: NONE SEEN
Sperm: NONE SEEN
Trich, Wet Prep: NONE SEEN
WBC, Wet Prep HPF POC: >=10 — AB (ref <10)
Yeast Wet Prep HPF POC: NONE SEEN

## 2023-07-01 NOTE — ED Provider Notes (Signed)
[facility] Provider Note  Patient Contact: 7:03 PM (approximate)   History   Vaginitis   HPI  Vanessa Holland is a 22 y.o. female with an unremarkable past medical history, patient presents to the emergency department with some abdominal discomfort that is since resolved.  Patient reports that she suspected that she might have a UTI.  She denies dysuria, hematuria or increased urinary frequency.  No flank pain, nausea or vomiting.  Patient denies a history of recent unprotected sex.  No fever or chills.  No vaginal rash.      Physical Exam   Triage Vital Signs: ED Triage Vitals [07/01/23 1845]  Encounter Vitals Group     BP 118/77     Systolic BP Percentile      Diastolic BP Percentile      Pulse Rate 94     Resp 17     Temp 98.3 F (36.8 C)     Temp Source Oral     SpO2 100 %     Weight 145 lb (65.8 kg)     Height 5\' 3"  (1.6 m)     Head Circumference      Peak Flow      Pain Score 5     Pain Loc      Pain Education      Exclude from Growth Chart     Most recent vital signs: Vitals:   07/01/23 1845  BP: 118/77  Pulse: 94  Resp: 17  Temp: 98.3 F (36.8 C)  SpO2: 100%     General: Alert and in no acute distress. Eyes:  PERRL. EOMI. Head: No acute traumatic findings ENT:      Nose: No congestion/rhinnorhea.      Mouth/Throat: Mucous membranes are moist. Neck: No stridor. No cervical spine tenderness to palpation. Cardiovascular:  Good peripheral perfusion Respiratory: Normal respiratory effort without tachypnea or retractions. Lungs CTAB. Good air entry to the bases with no decreased or absent breath sounds. Gastrointestinal: Bowel sounds 4 quadrants. Soft and nontender to palpation. No guarding or rigidity. No palpable masses. No distention. No CVA tenderness. Musculoskeletal: Full range of motion to all extremities.  Neurologic:  No gross focal neurologic deficits are appreciated.  Skin:   No rash noted    ED Results /  Procedures / Treatments   Labs (all labs ordered are listed, but only abnormal results are displayed) Labs Reviewed  WET PREP, GENITAL - Abnormal; Notable for the following components:      Result Value   WBC, Wet Prep HPF POC >=10 (*)    All other components within normal limits  URINALYSIS, ROUTINE W REFLEX MICROSCOPIC - Abnormal; Notable for the following components:   Color, Urine STRAW (*)    APPearance CLEAR (*)    All other components within normal limits  CHLAMYDIA/NGC RT PCR ([facility] ONLY)                 PROCEDURES:  Critical Care performed: No  Procedures   MEDICATIONS ORDERED IN ED: Medications - No data to display   IMPRESSION / MDM / ASSESSMENT AND PLAN / ED COURSE  I reviewed the triage vital signs and the nursing notes.                              Assessment and plan:  Abdominal cramping  22 year old female presents to the emergency department with some lower abdominal cramping that  is since resolved.  Patient denies dysuria, hematuria, increased urinary frequency, vaginal itching or rash.  Wet prep unremarkable.  Urinalysis shows no signs of UTI.  Gonorrhea and Chlamydia were negative.  Patient denies possibility of pregnancy.  Upon recheck, patient reported no discomfort.  Results of workup was communicated to patient and reassurance was given.     FINAL CLINICAL IMPRESSION(S) / ED DIAGNOSES   Final diagnoses:  Abdominal cramping     Rx / DC Orders   ED Discharge Orders     None        Note:  This document was prepared using Dragon voice recognition software and may include unintentional dictation errors.   Pia Mau [place], Cordelia Poche 07/01/23 2310    Minna Antis, MD 07/02/23 2252

## 2023-07-01 NOTE — ED Triage Notes (Signed)
Pt sts that she thinks that she has a uti. Pt sts that she is also having a yellow type discharge with abd pain.

## 2023-09-22 ENCOUNTER — Ambulatory Visit
Admission: EM | Admit: 2023-09-22 | Discharge: 2023-09-22 | Disposition: A | Discharge status: Home / Self Care | Payer: Medicaid Other | Attending: Emergency Medicine | Admitting: Emergency Medicine

## 2023-09-22 DIAGNOSIS — N939 Abnormal uterine and vaginal bleeding, unspecified: Secondary | ICD-10-CM | POA: Insufficient documentation

## 2023-09-22 LAB — CBC WITH DIFFERENTIAL/PLATELET
Abs Immature Granulocytes: 0.03 10*3/uL (ref 0.00–0.07)
Basophils Absolute: 0.1 10*3/uL (ref 0.0–0.1)
Basophils Relative: 1 %
Eosinophils Absolute: 0.1 10*3/uL (ref 0.0–0.5)
Eosinophils Relative: 1 %
HCT: 37.8 % (ref 36.0–46.0)
Hemoglobin: 12.6 g/dL (ref 12.0–15.0)
Immature Granulocytes: 0 %
Lymphocytes Relative: 36 %
Lymphs Abs: 3.4 10*3/uL (ref 0.7–4.0)
MCH: 29.5 pg (ref 26.0–34.0)
MCHC: 33.3 g/dL (ref 30.0–36.0)
MCV: 88.5 fL (ref 80.0–100.0)
Monocytes Absolute: 0.6 10*3/uL (ref 0.1–1.0)
Monocytes Relative: 6 %
Neutro Abs: 5.3 10*3/uL (ref 1.7–7.7)
Neutrophils Relative %: 56 %
Platelets: 307 10*3/uL (ref 150–400)
RBC: 4.27 MIL/uL (ref 3.87–5.11)
RDW: 13.4 % (ref 11.5–15.5)
WBC: 9.4 10*3/uL (ref 4.0–10.5)
nRBC: 0 % (ref 0.0–0.2)

## 2023-09-22 LAB — PREGNANCY, URINE: Preg Test, Ur: NEGATIVE

## 2023-09-22 MED ORDER — FERROUS SULFATE 325 (65 FE) MG PO TABS: 325.0000 mg | ORAL_TABLET | Freq: Every day | ORAL | 0 refills | Status: AC

## 2023-09-22 MED ORDER — NAPROXEN 500 MG PO TABS: 500.0000 mg | ORAL_TABLET | Freq: Two times a day (BID) | ORAL | 0 refills | Status: AC

## 2023-09-22 NOTE — ED Provider Notes (Signed)
HPI  SUBJECTIVE:  Vanessa Holland is a 22 y.o. female who presents with intermittent variable vaginal bleeding for the past 2 months.  She switched from Nexplanon to Depo 6 months ago.  Her second shot was 2 months ago, but is not sure how soon she started bleeding after getting it.  She reports slightly decreased exercise tolerance.  No nausea, vomiting, abdominal, back, pelvic pain, chest pain, lightheadedness, shortness of breath, fatigue, syncope.  She states that vaginal bleeding was brownish at first with an occasional clot, and is now bright red, sometimes brown.  She is not passing any more clots.  She states that she goes through 6 pads per day on a heavy day.  She had symptoms like this before postpartum when she had a Nexplanon.  It resolved when she removed it.  She denies vaginal odor or discharge.  No aggravating or alleviating factors.  She has not tried anything for symptoms.  Past medical history of BV.  No history of thrombocytopenia, coagulopathy, uterine fibroids, STD, PID, cancer, DVT, PE, smoking, endometriosis.  Last normal menstrual period: 2 months ago.  [facility].  OB/GYN: None.    Past Medical History:  Diagnosis Date   Umbilical hernia 2020    Past Surgical History:  Procedure Laterality Date   NO PAST SURGERIES      Family History  Problem Relation Age of Onset   Healthy Mother    Healthy Father     Social History   Tobacco Use   Smoking status: Never   Smokeless tobacco: Never  Vaping Use   Vaping status: Some Days  Substance Use Topics   Alcohol use: Never   Drug use: Never    No current facility-administered medications for this encounter.  Current Outpatient Medications:    ferrous sulfate 325 (65 FE) MG tablet, Take 1 tablet (325 mg total) by mouth daily., Disp: 30 tablet, Rfl: 0   naproxen (NAPROSYN) 500 MG tablet, Take 1 tablet (500 mg total) by mouth 2 (two) times daily for 5 days., Disp: 10 tablet, Rfl: 0  Allergies  Allergen  Reactions   Bee Pollen Anaphylaxis   Shrimp [Shellfish Allergy]      ROS  As noted in HPI.   Physical Exam  BP 112/73 (BP Location: Left Arm)   Pulse 89   Temp 98.4 F (36.9 C) (Oral)   Ht 5\' 3"  (1.6 m)   Wt 68 kg   SpO2 100%   BMI 26.57 kg/m   Constitutional: Well developed, well nourished, no acute distress Eyes:  EOMI, conjunctiva normal bilaterally HENT: Normocephalic, atraumatic,mucus membranes moist Respiratory: Normal inspiratory effort Cardiovascular: Normal rate GI: nondistended.  Soft.  No suprapubic, flank tenderness. Back: No CVAT GU: Deferred. skin: No rash, skin intact Musculoskeletal: no deformities Neurologic: Alert & oriented x 3, no focal neuro deficits Psychiatric: Speech and behavior appropriate   ED Course   Medications - No data to display  Orders Placed This Encounter  Procedures   CBC with Differential    Standing Status:   Standing    Number of Occurrences:   1   Pregnancy, urine    Standing Status:   Standing    Number of Occurrences:   1    Results for orders placed or performed during the hospital encounter of 09/22/23 (from the past 24 hour(s))  CBC with Differential     Status: None   Collection Time: 09/22/23  1:39 PM  Result Value Ref Range   WBC  9.4 4.0 - 10.5 K/uL   RBC 4.27 3.87 - 5.11 MIL/uL   Hemoglobin 12.6 12.0 - 15.0 g/dL   HCT 40.9 81.1 - 91.4 %   MCV 88.5 80.0 - 100.0 fL   MCH 29.5 26.0 - 34.0 pg   MCHC 33.3 30.0 - 36.0 g/dL   RDW 78.2 95.6 - 21.3 %   Platelets 307 150 - 400 K/uL   nRBC 0.0 0.0 - 0.2 %   Neutrophils Relative % 56 %   Neutro Abs 5.3 1.7 - 7.7 K/uL   Lymphocytes Relative 36 %   Lymphs Abs 3.4 0.7 - 4.0 K/uL   Monocytes Relative 6 %   Monocytes Absolute 0.6 0.1 - 1.0 K/uL   Eosinophils Relative 1 %   Eosinophils Absolute 0.1 0.0 - 0.5 K/uL   Basophils Relative 1 %   Basophils Absolute 0.1 0.0 - 0.1 K/uL   Immature Granulocytes 0 %   Abs Immature Granulocytes 0.03 0.00 - 0.07 K/uL   Pregnancy, urine     Status: None   Collection Time: 09/22/23  1:39 PM  Result Value Ref Range   Preg Test, Ur NEGATIVE NEGATIVE   No results found.  ED Clinical Impression  1. Abnormal uterine bleeding      ED Assessment/Plan     Patient is not pregnant.  Hemoglobin stable at 12.6, baseline is around 12.5.  Normal number of platelets.  Patient is describing intermittent vaginal bleeding of variable flow for the past 2 months.  She is not acutely anemic.  Suspect breakthrough bleeding from a Depo.  Will start her on Naprosyn 500 mg twice daily and supplemental iron.  Advised her to call the [place] [facility] for next steps.  Discussed with her that if she was having significantly heavy bleeding, I would consider something like Megace, but in this case, because it is variable and not very heavy, I think it is reasonable to try NSAIDs and supplemental iron first.  ER return precautions given.  Discussed labs, MDM, treatment plan, and plan for follow-up with patient. Discussed sn/sx that should prompt return to the ED. patient agrees with plan.   Meds ordered this encounter  Medications   naproxen (NAPROSYN) 500 MG tablet    Sig: Take 1 tablet (500 mg total) by mouth 2 (two) times daily for 5 days.    Dispense:  10 tablet    Refill:  0   ferrous sulfate 325 (65 FE) MG tablet    Sig: Take 1 tablet (325 mg total) by mouth daily.    Dispense:  30 tablet    Refill:  0      *This clinic note was created using Scientist, clinical (histocompatibility and immunogenetics). Therefore, there may be occasional mistakes despite careful proofreading.  ?    Domenick Gong, MD 09/23/23 1657

## 2023-09-22 NOTE — ED Triage Notes (Signed)
Pt was on Nexplanon before getting the depo injection  Pt states that this is her 2nd depo injection and is due for the 24th of October for the 3rd.  Pt is not worried about pregnancy.

## 2023-09-22 NOTE — ED Triage Notes (Signed)
Pt c/o continued menstrual cycle x49months  Pt is on the Depo Provera.   Pt states that she has been spotting frequently and it goes into a continual bleed before going back to spotting.  Pt is spotting currently and last menstrual bleed was 3 days ago.  Pt denies any abdominal pain, dizziness, or lightheadedness.

## 2023-09-22 NOTE — Discharge Instructions (Signed)
Your hemoglobin is 12.6 today, your baseline is 12.5.  You are not pregnant.  Call the [place] clinic and asked them what to do next.  In the meantime, you can try Naprosyn 500 mg twice a day to help slow down the bleeding.  Supplemental iron will also help build up your hemoglobin.  You can stop it if it makes you constipated.

## 2023-10-17 ENCOUNTER — Encounter: Payer: Self-pay | Admitting: Emergency Medicine

## 2023-10-17 ENCOUNTER — Ambulatory Visit
Admission: EM | Admit: 2023-10-17 | Discharge: 2023-10-17 | Disposition: A | Discharge status: Home / Self Care | Payer: Medicaid Other | Attending: Emergency Medicine | Admitting: Emergency Medicine

## 2023-10-17 DIAGNOSIS — Z09 Encounter for follow-up examination after completed treatment for conditions other than malignant neoplasm: Secondary | ICD-10-CM | POA: Diagnosis not present

## 2023-10-17 DIAGNOSIS — Z5189 Encounter for other specified aftercare: Secondary | ICD-10-CM

## 2023-10-17 MED ORDER — CEPHALEXIN 500 MG PO CAPS: 1000.0000 mg | ORAL_CAPSULE | Freq: Two times a day (BID) | ORAL | 0 refills | Status: AC

## 2023-10-17 NOTE — ED Provider Notes (Signed)
HPI  SUBJECTIVE:  Vanessa Holland is a 22 y.o. female who presents with ***    Past Medical History:  Diagnosis Date   Umbilical hernia 2020    Past Surgical History:  Procedure Laterality Date   HERNIA REPAIR     NO PAST SURGERIES      Family History  Problem Relation Age of Onset   Healthy Mother    Healthy Father     Social History   Tobacco Use   Smoking status: Never   Smokeless tobacco: Never  Vaping Use   Vaping status: Some Days  Substance Use Topics   Alcohol use: Never   Drug use: Never    No current facility-administered medications for this encounter.  Current Outpatient Medications:    cephALEXin (KEFLEX) 500 MG capsule, Take 2 capsules (1,000 mg total) by mouth 2 (two) times daily for 5 days., Disp: 20 capsule, Rfl: 0   medroxyPROGESTERone (DEPO-PROVERA) 150 MG/ML injection, , Disp: , Rfl:    ferrous sulfate 325 (65 FE) MG tablet, Take 1 tablet (325 mg total) by mouth daily., Disp: 30 tablet, Rfl: 0   oxyCODONE (OXY IR/ROXICODONE) 5 MG immediate release tablet, Take 5 mg by mouth every 6 (six) hours as needed., Disp: , Rfl:   Allergies  Allergen Reactions   Bee Pollen Anaphylaxis   Shrimp [Shellfish Allergy]      ROS  As noted in HPI.   Physical Exam  BP 102/69 (BP Location: Right Arm)   Pulse 84   Temp 99 F (37.2 C) (Oral)   Resp 15   Ht 5\' 3"  (1.6 m)   Wt 68 kg   LMP  (LMP Unknown)   SpO2 98%   BMI 26.56 kg/m   Constitutional: Well developed, well nourished, no acute distress Eyes:  EOMI, conjunctiva normal bilaterally HENT: Normocephalic, atraumatic,mucus membranes moist Respiratory: Normal inspiratory effort Cardiovascular: Normal rate GI: nondistended Tender yellowish discoloration surrounding surgical site.  No erythema, induration.  No expressible purulent drainage.  No crusting.  Left Steri-Strips in place.   skin: No rash, skin intact Musculoskeletal: no deformities Neurologic: Alert & oriented x 3, no focal  neuro deficits Psychiatric: Speech and behavior appropriate   ED Course   Medications - No data to display  No orders of the defined types were placed in this encounter.   No results found for this or any previous visit (from the past 24 hour(s)). No results found.  ED Clinical Impression  1. Status post umbilical hernia repair, follow-up exam   2. Visit for wound check      ED Assessment/Plan   {The patient has been seen in Urgent Care in the last 3 years. :1}  I suspect this is postoperative bruising.  Low suspicion for infection, however, will start on Keflex 1000 mg p.o. twice daily for 5 days.  No evidence of dehiscence.  She plans to call her surgeon in 2 days and will check in with him regarding continuing the antibiotics at that time.  Strict ER return precautions given.  Discussed MDM, treatment plan, and plan for follow-up with patient. Discussed sn/sx that should prompt return to the ED. patient agrees with plan.   Meds ordered this encounter  Medications   cephALEXin (KEFLEX) 500 MG capsule    Sig: Take 2 capsules (1,000 mg total) by mouth 2 (two) times daily for 5 days.    Dispense:  20 capsule    Refill:  0      *This clinic note  was created using Scientist, clinical (histocompatibility and immunogenetics). Therefore, there may be occasional mistakes despite careful proofreading.  ?

## 2023-10-17 NOTE — ED Triage Notes (Signed)
Patient states that she had hernia surgery on Monday.  Patient states the area at the incision started to look yellow and increase in tenderness.  Patient denies any drainage from the incision.  Patient denies fevers.

## 2023-10-17 NOTE — Discharge Instructions (Addendum)
I suspect that this is postoperative bruising.  I have low suspicion for infection or of your wound coming apart, however, I am going to start you on Keflex twice a day in case there is a very early infection.  Please follow-up with your surgeon on Monday and check in with them regarding continuing the Keflex.

## 2024-11-18 ENCOUNTER — Ambulatory Visit
Admission: RE | Admit: 2024-11-18 | Discharge: 2024-11-18 | Disposition: A | Discharge status: Home / Self Care | Source: Ambulatory Visit | Attending: Emergency Medicine | Admitting: Emergency Medicine

## 2024-11-18 ENCOUNTER — Ambulatory Visit

## 2024-11-18 VITALS — BP 114/73 | HR 79 | Temp 99.1°F | Resp 14 | Wt 154.0 lb

## 2024-11-18 DIAGNOSIS — M79672 Pain in left foot: Secondary | ICD-10-CM

## 2024-11-18 NOTE — ED Provider Notes (Addendum)
 [facility]    CSN: [id number] Arrival date & time: 11/18/24  1415      History   Chief Complaint Chief Complaint  Patient presents with   Foot Pain    Appointment    HPI Vanessa Holland is a 23 y.o. female.   HPI  23 year old female past medical history significant for ADHD presents for evaluation of pain in her proximal left arch that has been going on for a while but has worsened over the last month.  She denies any recent injury.  Past Medical History:  Diagnosis Date   Umbilical hernia 2020    Patient Active Problem List   Diagnosis Date Noted   Gestational hypertension 10/01/2020   Unwanted fertility 10/01/2020   Vaginal delivery 09/30/2020   Supervision of normal first teen pregnancy 09/30/2020   Infection of urinary tract in pregnancy 09/30/2020   Low back pain during pregnancy 09/22/2020   Late prenatal care affecting pregnancy 07/18/2020   Encounter for supervision of normal first pregnancy in second trimester 06/28/2020   Attention deficit hyperactivity disorder (ADHD) 01/07/2018    Past Surgical History:  Procedure Laterality Date   HERNIA REPAIR     NO PAST SURGERIES      OB History     Gravida  1   Para  1   Term  1   Preterm      AB      Living  1      SAB      IAB      Ectopic      Multiple  0   Live Births  1            Home Medications    Prior to Admission medications  Medication Sig Start Date End Date Taking? Authorizing Provider  oxybutynin (DITROPAN-XL) 5 MG 24 hr tablet OXYBUTYNIN CHLORIDE ER 5 MG XR24H-TAB 09/28/24  Yes [provider]  ferrous sulfate  325 (65 FE) MG tablet Take 1 tablet (325 mg total) by mouth daily. 09/22/23   Mortenson, Ashley, MD  medroxyPROGESTERone (DEPO-PROVERA) 150 MG/ML injection  07/03/23   [provider]  amLODipine  (NORVASC ) 5 MG tablet Take 1 tablet (5 mg total) by mouth daily. 10/02/20 11/14/20  Myrl Therisa BRAVO, MD    Family History Family  History  Problem Relation Age of Onset   Healthy Mother    Healthy Father     Social History Social History[1]   Allergies   Bee pollen and Shrimp [shellfish allergy]   Review of Systems Review of Systems  Skin:        Pain in left arch      Physical Exam Triage Vital Signs ED Triage Vitals  Encounter Vitals Group     BP 11/18/24 1431 114/73     Girls Systolic BP Percentile --      Girls Diastolic BP Percentile --      Boys Systolic BP Percentile --      Boys Diastolic BP Percentile --      Pulse Rate 11/18/24 1431 79     Resp 11/18/24 1431 14     Temp 11/18/24 1431 99.1 F (37.3 C)     Temp Source 11/18/24 1431 Oral     SpO2 11/18/24 1431 98 %     Weight 11/18/24 1429 154 lb (69.9 kg)     Height --      Head Circumference --      Peak Flow --  Pain Score 11/18/24 1430 8     Pain Loc --      Pain Education --      Exclude from Growth Chart --    No data found.  Updated Vital Signs BP 114/73 (BP Location: Right Arm)   Pulse 79   Temp 99.1 F (37.3 C) (Oral)   Resp 14   Wt 154 lb (69.9 kg)   SpO2 98%   BMI 27.28 kg/m   Visual Acuity Right Eye Distance:   Left Eye Distance:   Bilateral Distance:    Right Eye Near:   Left Eye Near:    Bilateral Near:     Physical Exam Vitals and nursing note reviewed.  Constitutional:      Appearance: Normal appearance.  Musculoskeletal:        General: Swelling and tenderness present. No signs of injury.  Skin:    General: Skin is warm.     Capillary Refill: Capillary refill takes less than 2 seconds.     Findings: No bruising or erythema.  Neurological:     General: No focal deficit present.     Mental Status: She is alert and oriented to person, place, and time.      UC Treatments / Results  Labs (all labs ordered are listed, but only abnormal results are displayed) Labs Reviewed - No data to display  EKG   Radiology No results found.  Procedures Procedures (including critical care  time)  Medications Ordered in UC Medications - No data to display  Initial Impression / Assessment and Plan / UC Course  I have reviewed the triage vital signs and the nursing notes.  Pertinent labs & imaging results that were available during my care of the patient were reviewed by me and considered in my medical decision making (see chart for details).   Patient is a pleasant, nontoxic-appearing 23 year old female presenting for evaluation of pain in her left foot that has been going on for some time but has worsened over the last month.  She denies any recent injury but reports that as a child she did injure her foot.  On exam, the patient does have a visible and palpable protrusion that is either attached to the proximal aspect of the medial cuneiform or to the navicular.  It is tender to palpation but it is not mobile.  There is no overlying edema, ecchymosis, or erythema.  DP and PT pulses are 2+.  I will obtain radiograph of the left foot to evaluate for any bony injury.  Left foot x-rays independent reviewed and evaluated by me.  Impression: No evidence of acute fracture or dislocation.  There is a bony prominence to the left navicular.  Soft tissue swelling is unremarkable.  Radiology read is pending. Radiology impression states no acute osseous abnormality.  Type II os naviculare, normal variant.  Mild Hallux valgus.  I will discharge patient home with diagnosis of left foot pain and refer her to podiatry for further evaluation.  She may use over-the-counter Tylenol  and/or ibuprofen  as needed for pain.   Final Clinical Impressions(s) / UC Diagnoses   Final diagnoses:  Left foot pain     Discharge Instructions      Trays do not show any evidence of broken bones.  There is a prominent bone growth on your navicular bone which may be what is causing your pain.  Use over-the-counter Tylenol  and/or ibuprofen  to help with pain and inflammation.  I have referred you to podiatry for  further evaluation and to discuss treatment options.     ED Prescriptions   None    PDMP not reviewed this encounter.    Bernardino Ditch, NP 11/18/24 1516     [1]  Social History Tobacco Use   Smoking status: Never   Smokeless tobacco: Never  Vaping Use   Vaping status: Some Days  Substance Use Topics   Alcohol use: Never   Drug use: Never     Bernardino Ditch, NP 11/18/24 1545

## 2024-11-18 NOTE — Discharge Instructions (Addendum)
 Trays do not show any evidence of broken bones.  There is a prominent bone growth on your navicular bone which may be what is causing your pain.  Use over-the-counter Tylenol  and/or ibuprofen  to help with pain and inflammation.  I have referred you to podiatry for further evaluation and to discuss treatment options.

## 2024-11-18 NOTE — ED Triage Notes (Signed)
 Patient c/o pain on the inside part of her left foot for a month or so.  Patient denies any recent injury or fall.

## 2024-12-27 ENCOUNTER — Ambulatory Visit: Admitting: Podiatry
